# Patient Record
Sex: Female | Born: 1945 | Race: White | Hispanic: No | Smoking: Former smoker
Health system: Southern US, Community
[De-identification: ages and names within clinical notes are randomized; demographics above are authoritative.]

## PROBLEM LIST (undated history)

## (undated) DIAGNOSIS — E079 Disorder of thyroid, unspecified: Secondary | ICD-10-CM

## (undated) DIAGNOSIS — R760 Raised antibody titer: Secondary | ICD-10-CM

## (undated) DIAGNOSIS — F419 Anxiety disorder, unspecified: Secondary | ICD-10-CM

## (undated) DIAGNOSIS — D689 Coagulation defect, unspecified: Secondary | ICD-10-CM

## (undated) DIAGNOSIS — I341 Nonrheumatic mitral (valve) prolapse: Secondary | ICD-10-CM

## (undated) DIAGNOSIS — Z9889 Other specified postprocedural states: Secondary | ICD-10-CM

## (undated) DIAGNOSIS — E785 Hyperlipidemia, unspecified: Secondary | ICD-10-CM

## (undated) DIAGNOSIS — I340 Nonrheumatic mitral (valve) insufficiency: Secondary | ICD-10-CM

## (undated) DIAGNOSIS — E039 Hypothyroidism, unspecified: Secondary | ICD-10-CM

## (undated) DIAGNOSIS — R011 Cardiac murmur, unspecified: Secondary | ICD-10-CM

## (undated) HISTORY — DX: Nonrheumatic mitral (valve) prolapse: I34.1

## (undated) HISTORY — PX: TUBAL LIGATION: SHX77

## (undated) HISTORY — PX: TONSILLECTOMY: SUR1361

## (undated) HISTORY — DX: Disorder of thyroid, unspecified: E07.9

## (undated) HISTORY — DX: Coagulation defect, unspecified: D68.9

## (undated) HISTORY — DX: Nonrheumatic mitral (valve) insufficiency: I34.0

## (undated) HISTORY — DX: Cardiac murmur, unspecified: R01.1

## (undated) HISTORY — DX: Raised antibody titer: R76.0

## (undated) HISTORY — DX: Hyperlipidemia, unspecified: E78.5

---

## 2005-10-19 ENCOUNTER — Encounter: Admission: RE | Admit: 2005-10-19 | Discharge: 2005-10-19 | Payer: Self-pay | Admitting: Gastroenterology

## 2016-06-08 HISTORY — PX: CARDIAC CATHETERIZATION: SHX172

## 2016-07-21 ENCOUNTER — Institutional Professional Consult (permissible substitution) (INDEPENDENT_AMBULATORY_CARE_PROVIDER_SITE_OTHER): Payer: Federal, State, Local not specified - PPO | Admitting: Thoracic Surgery (Cardiothoracic Vascular Surgery)

## 2016-07-21 ENCOUNTER — Other Ambulatory Visit: Payer: Self-pay

## 2016-07-21 ENCOUNTER — Other Ambulatory Visit: Payer: Self-pay | Admitting: *Deleted

## 2016-07-21 VITALS — BP 153/82 | HR 92 | Resp 16 | Ht 63.0 in | Wt 102.0 lb

## 2016-07-21 DIAGNOSIS — Z01818 Encounter for other preprocedural examination: Secondary | ICD-10-CM

## 2016-07-21 DIAGNOSIS — I7409 Other arterial embolism and thrombosis of abdominal aorta: Secondary | ICD-10-CM

## 2016-07-21 DIAGNOSIS — I7101 Dissection of thoracic aorta: Secondary | ICD-10-CM

## 2016-07-21 DIAGNOSIS — I71019 Dissection of thoracic aorta, unspecified: Secondary | ICD-10-CM

## 2016-07-21 DIAGNOSIS — I340 Nonrheumatic mitral (valve) insufficiency: Secondary | ICD-10-CM | POA: Diagnosis not present

## 2016-07-21 DIAGNOSIS — R760 Raised antibody titer: Secondary | ICD-10-CM | POA: Diagnosis not present

## 2016-07-21 MED ORDER — AMIODARONE HCL 200 MG PO TABS: 200.0000 mg | ORAL_TABLET | Freq: Every day | ORAL | 0 refills | Status: DC

## 2016-07-21 NOTE — Patient Instructions (Signed)
Stop taking Coumadin and begin taking amiodarone on Saturday 2/24  Continue taking all other medications without change through the day before surgery.  Have nothing to eat or drink after midnight the night before surgery.  On the morning of surgery do not take any medications.

## 2016-07-21 NOTE — Progress Notes (Signed)
[address]       [place] [address]             [phone]     CARDIOTHORACIC SURGERY CONSULTATION REPORT  Referring Provider is Gypsy Balsam, MD PCP is Lise Auer, MD  Chief Complaint  Patient presents with  . Mitral Regurgitation    Surgical eval, Cardiac Cath 06/08/16, ECHO 04/04/16-@ [facility]     HPI:  Patient is a 71 year old female with reported history of mitral valve prolapse who has been referred for surgical consultation to discuss treatment options for management of severe mitral regurgitation. The patient states that she was told that she had mitral valve prolapse many years ago. She has never been formally evaluated by a cardiologist until recently. In 2006 the patient was hospitalized briefly at [facility] for what was felt to be a possible embolic event to her right thumb.  Prior to that the patient had reportedly experienced a similar episode of sudden onset of blue discoloration to the third finger of the right hand several months previously for which she has not seen medical attention. 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 She was started on Coumadin and transferred to [facility] in [place]. She was felt to have suffered a likely embolic event. According to her recollection no echocardiogram was done. 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 She has been on chronic warfarin anticoagulation ever since. The patient has not had any further episodes of possible embolic events and she has no documented history of atrial fibrillation. She recently complained to her primary care physician that she noted that if she climbed a flight of stairs she would note that her heart was pounding and racing faster than she thought appropriate. She denied any symptoms of  exertional shortness of breath. Her primary care physician noted a loud holosystolic murmur on physical exam and referred her for a transthoracic echocardiogram and cardiology evaluation. Echocardiogram performed 04/04/2016 in [place] was found to reveal normal left ventricular systolic function with mitral valve prolapse and "moderate mitral regurgitation". There was severe left atrial enlargement.  No other significant abnormalities were noted. The patient subsequently underwent left and right heart catheterization by Dr. Rhona Leavens on 06/08/2016.  The patient was found to have normal coronary arteries with normal left ventricular systolic function and normal right heart pressures. There was severe mitral regurgitation appreciated at catheterization. The patient was referred for elective surgical consultation.  The patient is widowed and lives in [place] with her sister. She has been relatively healthy for all of her life and she reports no significant physical limitations. She remains entirely functionally independent. However, she does not exercise on a regular basis and she does not walk regularly. She specifically denies any symptoms of exertional shortness of breath or chest discomfort.  She denies any history of irregular heart rhythm, dizzy spells, orthopnea, lower extremity edema, or syncope.  She has remained on Coumadin since 2006 with no bleeding complications.  Past Medical History:  Diagnosis Date  . Clotting disorder (HCC)   . Disease of thyroid gland   . Dyslipidemia   . Heart murmur   . Lupus anticoagulant positive   . Mitral regurgitation   . 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 Mitral valve prolapse     Past Surgical History:  Procedure Laterality Date  .  CARDIAC CATHETERIZATION  06/08/2016  . TONSILLECTOMY    . TUBAL LIGATION      Family History  Problem Relation Age of Onset  . Aortic aneurysm Mother   . Heart disease Father     Social History   Social History   . Marital status: Unknown    Spouse name: N/A  . Number of children: N/A  . Years of education: N/A   Occupational History  . retired    Social History Main Topics  . Smoking status: Former Smoker    Packs/day: 0.50    Years: 15.00    Types: Cigarettes    Quit date: 07/22/1979  . Smokeless tobacco: Never Used  . Alcohol use Yes     Comment: SOCIAL  . Drug use: No  . Sexual activity: Not Currently   Other Topics Concern  . Not on file   Social History Narrative  . No narrative on file    Current Outpatient Prescriptions  Medication Sig Dispense Refill  . amoxicillin (AMOXIL) 500 MG tablet     . ibandronate (BONIVA) 150 MG tablet Take 150 mg by mouth every 30 (thirty) days.     Marland Kitchen levothyroxine (SYNTHROID, LEVOTHROID) 75 MCG tablet Take 75 mcg by mouth daily before breakfast.     . pravastatin (PRAVACHOL) 20 MG tablet Take 20 mg by mouth daily.      No current facility-administered medications for this visit.     No Known Allergies    Review of Systems:   General:  normal appetite, normal energy, no weight gain, no weight loss, no fever  Cardiac:  no chest pain with exertion, no chest pain at rest, no SOB with exertion, no resting SOB, no PND, no orthopnea, + palpitations, no arrhythmia, no atrial fibrillation, no LE edema, no dizzy spells, no syncope  Respiratory:  no shortness of breath, no home oxygen, no productive cough, no dry cough, no bronchitis, no wheezing, no hemoptysis, no asthma, no pain with inspiration or cough, no sleep apnea, no CPAP at night  GI:   no difficulty swallowing, no reflux, no frequent heartburn, no hiatal hernia, no abdominal pain, no constipation, no diarrhea, no hematochezia, no hematemesis, no melena  GU:   no dysuria,  no frequency, no urinary tract infection, no hematuria, no enlarged prostate, no kidney stones, no kidney disease  Vascular:  no pain suggestive of claudication, no pain in feet, no leg cramps, no varicose veins, no DVT,  no non-healing foot ulcer  Neuro:   no stroke, no TIA's, no seizures, no headaches, no temporary blindness one eye,  no slurred speech, no peripheral neuropathy, no chronic pain, no instability of gait, no memory/cognitive dysfunction  Musculoskeletal: no arthritis, no joint swelling, no myalgias, no difficulty walking, normal mobility   Skin:   no rash, no itching, no skin infections, no pressure sores or ulcerations  Psych:   + anxiety, no depression, + nervousness, no unusual recent stress  Eyes:   no blurry vision, no floaters, no recent vision changes, + wears glasses or contacts  ENT:   no hearing loss, no loose or painful teeth, no dentures, last saw dentist 6 months ago and scheduled for routine cleaning next week  Hematologic:  no easy bruising, no abnormal bleeding, no clotting disorder, no frequent epistaxis  Endocrine:  no diabetes, does not check CBG's at home     Physical Exam:   BP (!) 153/82 (BP Location: Left Arm, Patient Position: Sitting, Cuff Size: Normal)  Pulse 92   Resp 16   Ht 5\' 3"  (1.6 m)   Wt 102 lb (46.3 kg)   SpO2 94% Comment: ON RA  BMI 18.07 kg/m   General:  Thin,  well-appearing  HEENT:  Unremarkable   Neck:   no JVD, no bruits, no adenopathy   Chest:   clear to auscultation, symmetrical breath sounds, no wheezes, no rhonchi   CV:   RRR, prominent grade IV/VI holosystolic murmur all across precordium  Abdomen:  soft, non-tender, no masses   Extremities:  warm, well-perfused, pulses diminished but palpable, no LE edema  Rectal/GU  Deferred  Neuro:   Grossly non-focal and symmetrical throughout  Skin:   Clean and dry, no rashes, no breakdown   Diagnostic Tests:  TRANSTHORACIC ECHOCARDIOGRAM  Both report and images from transthoracic echocardiogram performed 04/04/2016 are reviewed. 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  There is at least  moderate and likely severe mitral regurgitation. There is severe left atrial enlargement. There is normal left ventricular size and systolic function. Aortic valve appears normal.   DIAGNOSTIC CARDIAC CATHETERIZATION  Both images and report from diagnostic cardiac catheterization performed 06/08/2016 are reviewed. The patient has normal coronary artery anatomy with no significant coronary artery disease. Left ventricular size appears normal. Left ventricular function appears normal. There is severe mitral regurgitation by angiography. Pulmonary artery pressures were reportedly normal.   Impression:  Patient has stage C severe asymptomatic primary mitral regurgitation due to mitral valve prolapse.  I have personally reviewed the patient's recent transthoracic echocardiogram. There appears to be a large flail segment involving the middle scallop of the posterior leaflet with at least moderate and probably severe mitral regurgitation. Left ventricular size and systolic function appears normal. There are no other, complicating features. Diagnostic cardiac catheterization was notable for the absence of significant coronary artery disease and confirmed the presence of severe mitral regurgitation.   Options at this time include continued medical therapy with close follow-up versus elective mitral valve repair.  Based upon review of the patient's echocardiogram feel there is greater than 95% likelihood that her mitral valve can be repaired successfully with anticipation of an excellent and durable long-term result.  The patient appears remarkably healthy otherwise with exception of history of positive lupus anticoagulant. Risks associated with surgery should be very low. The patient is to be a good candidate for minimally invasive approach for surgery.   Plan:  The patient and her sister were counseled at length regarding the indications, risks and potential benefits of mitral valve repair.  The rationale for  elective surgery has been explained, including a comparison between surgery and continued medical therapy with close follow-up.  The likelihood of successful and durable valve repair has been discussed with particular reference to the findings of their recent echocardiogram.  Based upon these findings and previous experience, I have quoted them a greater than 95 percent likelihood of successful valve repair.   The patient understands and accepts all potential risks of surgery including but not limited to risk of death, stroke or other neurologic complication, myocardial infarction, congestive heart failure, respiratory failure, renal failure, bleeding requiring transfusion and/or reexploration, arrhythmia, infection or other wound complications, pneumonia, pleural and/or pericardial effusion, pulmonary embolus, aortic dissection or other major vascular complication, or delayed complications related to valve repair or replacement including but not limited to structural valve deterioration and failure, thrombosis, embolization, endocarditis, or  paravalvular leak.  Alternative surgical approaches have been discussed including a comparison between conventional sternotomy and minimally-invasive techniques.  The relative risks and benefits of each have been reviewed as they pertain to the patient's specific circumstances, and all of their questions have been addressed.  Specific risks potentially related to the minimally-invasive approach were discussed at length, including but not limited to risk of conversion to full or partial sternotomy, aortic dissection or other major vascular complication, unilateral acute lung injury or pulmonary edema, phrenic nerve dysfunction or paralysis, rib fracture, chronic pain, lung hernia, or lymphocele.  Expectations for her postoperative recovery have been discussed at length. All of their questions have been answered.  We tentatively plan to proceed with surgery on 07/28/2016. The  patient has been instructed to stop taking Coumadin on 07/23/2016 in anticipation of surgery. In addition, she has been given a prescription for amiodarone to begin prior to surgery to decrease her risk of perioperative atrial dysrhythmias.   I spent in excess of 90 minutes during the conduct of this office consultation and >50% of this time involved direct face-to-face encounter with the patient for counseling and/or coordination of their care.    Salvatore Decentlarence H. Cornelius Moraswen, MD 07/21/2016 4:47 PM

## 2016-07-22 ENCOUNTER — Other Ambulatory Visit: Payer: Self-pay | Admitting: *Deleted

## 2016-07-22 DIAGNOSIS — I340 Nonrheumatic mitral (valve) insufficiency: Secondary | ICD-10-CM

## 2016-07-24 NOTE — H&P (Signed)
[address]       [place] [address]             [phone]          CARDIOTHORACIC SURGERY HISTORY AND PHYSICAL EXAM  Referring Provider is Jenne Campus, MD PCP is Mateo Flow, MD      Chief Complaint  Patient presents with  . Mitral Regurgitation    Surgical eval, Cardiac Cath 06/08/16, ECHO 04/04/16-@ [facility]     HPI:  Patient is a 71 year old female with reported history of mitral valve prolapse who has been referred for surgical consultation to discuss treatment options for management of severe mitral regurgitation. The patient states that she was told that she had mitral valve prolapse many years ago. She has never been formally evaluated by a cardiologist until recently. In 2006 the patient was hospitalized briefly at [facility] for what was felt to be a possible embolic event to her right thumb.  Prior to that the patient had reportedly experienced a similar episode of sudden onset of blue discoloration to the third finger of the right hand several months previously for which she has not seen medical attention. 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 She was started on Coumadin and transferred to [facility] in [place]. She was felt to have suffered a likely embolic event. According to her recollection no echocardiogram was done. 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 She has been on chronic warfarin anticoagulation ever since. The patient has not had any further episodes of possible embolic events and she has no documented history of atrial fibrillation. She recently complained to her primary care physician that she noted that if she climbed a flight of stairs she would note that her heart was pounding and racing faster than she thought appropriate. She  denied any symptoms of exertional shortness of breath. Her primary care physician noted a loud holosystolic murmur on physical exam and referred her for a transthoracic echocardiogram and cardiology evaluation. Echocardiogram performed 04/04/2016 in  was found to reveal normal left ventricular systolic function with mitral valve prolapse and "moderate mitral regurgitation". There was severe left atrial enlargement.  No other significant abnormalities were noted. The patient subsequently underwent left and right heart catheterization by Dr. Wyline Copas on 06/08/2016.  The patient was found to have normal coronary arteries with normal left ventricular systolic function and normal right heart pressures. There was severe mitral regurgitation appreciated at catheterization. The patient was referred for elective surgical consultation.  The patient is widowed and lives in [place] with her sister. She has been relatively healthy for all of her life and she reports no significant physical limitations. She remains entirely functionally independent. However, she does not exercise on a regular basis and she does not walk regularly. She specifically denies any symptoms of exertional shortness of breath or chest discomfort.  She denies any history of irregular heart rhythm, dizzy spells, orthopnea, lower extremity edema, or syncope.  She has remained on Coumadin since 2006 with no bleeding complications.    Past Medical History:  Diagnosis Date  . Clotting disorder ([place])   . Disease of thyroid gland   . Dyslipidemia   . Heart murmur   . Lupus anticoagulant positive   . Mitral regurgitation   . 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 Mitral valve  prolapse     Past Surgical History:  Procedure Laterality Date  . CARDIAC CATHETERIZATION  06/08/2016  . TONSILLECTOMY    . TUBAL LIGATION      Family History  Problem Relation Age of Onset  . Aortic aneurysm Mother   . Heart disease Father      Social History Social History  Substance Use Topics  . Smoking status: Former Smoker    Packs/day: 0.50    Years: 15.00    Types: Cigarettes    Quit date: 07/22/1979  . Smokeless tobacco: Never Used  . Alcohol use Yes     Comment: SOCIAL    Prior to Admission medications   Medication Sig Start Date End Date Taking? Authorizing Provider  ALPRAZolam Duanne Moron) 0.5 MG tablet Take 0.25 mg by mouth at bedtime as needed for anxiety.   Yes Historical Provider, MD  calcium carbonate (OSCAL) 1500 (600 Ca) MG TABS tablet Take 1,500 mg by mouth 2 (two) times daily with a meal.   Yes Historical Provider, MD  ibandronate (BONIVA) 150 MG tablet Take 150 mg by mouth every 30 (thirty) days.  07/18/16  Yes Historical Provider, MD  levothyroxine (SYNTHROID, LEVOTHROID) 75 MCG tablet Take 75 mcg by mouth daily before breakfast.  07/18/16  Yes Historical Provider, MD  MAGNESIUM-ZINC PO Take 1 tablet by mouth daily.   Yes Historical Provider, MD  MetroNIDAZOLE-Cleanser 0.75 % (Cream) KIT Apply 1 application topically at bedtime.   Yes Historical Provider, MD  Multiple Vitamin (MULTIVITAMIN WITH MINERALS) TABS tablet Take 1 tablet by mouth daily.   Yes Historical Provider, MD  Omega-3 Fatty Acids (FISH OIL) 1200 MG CAPS Take 2,400 mg by mouth daily.   Yes Historical Provider, MD  pravastatin (PRAVACHOL) 20 MG tablet Take 20 mg by mouth every evening.  06/20/16  Yes Historical Provider, MD  vitamin C (ASCORBIC ACID) 500 MG tablet Take 500 mg by mouth 2 (two) times daily.   Yes Historical Provider, MD  warfarin (COUMADIN) 5 MG tablet Take 5 mg by mouth daily.   Yes Historical Provider, MD  amiodarone (PACERONE) 200 MG tablet Take 1 tablet (200 mg total) by mouth daily. Patient taking differently: Take 200 mg by mouth daily. Will start medication 07-23-2016 07/21/16   Rexene Alberts, MD    No Known Allergies   Review of Systems:              General:                      normal appetite, normal energy, no  weight gain, no weight loss, no fever             Cardiac:                       no chest pain with exertion, no chest pain at rest, no SOB with exertion, no resting SOB, no PND, no orthopnea, + palpitations, no arrhythmia, no atrial fibrillation, no LE edema, no dizzy spells, no syncope             Respiratory:                 no shortness of breath, no home oxygen, no productive cough, no dry cough, no bronchitis, no wheezing, no hemoptysis, no asthma, no pain with inspiration or cough, no sleep apnea, no CPAP at night             GI:  no difficulty swallowing, no reflux, no frequent heartburn, no hiatal hernia, no abdominal pain, no constipation, no diarrhea, no hematochezia, no hematemesis, no melena             GU:                              no dysuria,  no frequency, no urinary tract infection, no hematuria, no enlarged prostate, no kidney stones, no kidney disease             Vascular:                     no pain suggestive of claudication, no pain in feet, no leg cramps, no varicose veins, no DVT, no non-healing foot ulcer             Neuro:                         no stroke, no TIA's, no seizures, no headaches, no temporary blindness one eye,  no slurred speech, no peripheral neuropathy, no chronic pain, no instability of gait, no memory/cognitive dysfunction             Musculoskeletal:         no arthritis, no joint swelling, no myalgias, no difficulty walking, normal mobility              Skin:                            no rash, no itching, no skin infections, no pressure sores or ulcerations             Psych:                         + anxiety, no depression, + nervousness, no unusual recent stress             Eyes:                           no blurry vision, no floaters, no recent vision changes, + wears glasses or contacts             ENT:                            no hearing loss, no loose or painful teeth, no dentures, last saw dentist 6 months ago and  scheduled for routine cleaning next week             Hematologic:               no easy bruising, no abnormal bleeding, no clotting disorder, no frequent epistaxis             Endocrine:                   no diabetes, does not check CBG's at home                           Physical Exam:              BP (!) 153/82 (BP Location: Left Arm, Patient Position: Sitting, Cuff Size: Normal)   Pulse 92   Resp 16   Ht  $'5\' 3"'z$  (1.6 m)   Wt 102 lb (46.3 kg)   SpO2 94% Comment: ON RA  BMI 18.07 kg/m              General:                      Thin,  well-appearing             HEENT:                       Unremarkable              Neck:                           no JVD, no bruits, no adenopathy              Chest:                          clear to auscultation, symmetrical breath sounds, no wheezes, no rhonchi              CV:                              RRR, prominent grade IV/VI holosystolic murmur all across precordium             Abdomen:                    soft, non-tender, no masses              Extremities:                 warm, well-perfused, pulses diminished but palpable, no LE edema             Rectal/GU                   Deferred             Neuro:                         Grossly non-focal and symmetrical throughout             Skin:                            Clean and dry, no rashes, no breakdown   Diagnostic Tests:  TRANSTHORACIC ECHOCARDIOGRAM  Both report and images from transthoracic echocardiogram performed 04/04/2016 are reviewed. 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  There is at least moderate and likely severe mitral regurgitation. There is severe left atrial enlargement. There is normal left ventricular size and systolic function. Aortic valve appears normal.   DIAGNOSTIC CARDIAC CATHETERIZATION  Both images and report from diagnostic cardiac  catheterization performed 06/08/2016 are reviewed. The patient has normal coronary artery anatomy with no significant coronary artery disease. Left ventricular size appears normal. Left ventricular function appears normal. There is severe mitral regurgitation by angiography. Pulmonary artery pressures were reportedly normal.   Impression:  Patient has stage C severe asymptomatic primary mitral regurgitation due to mitral valve prolapse.  I have personally reviewed the patient's recent transthoracic echocardiogram. There appears to be a large  flail segment involving the middle scallop of the posterior leaflet with at least moderate and probably severe mitral regurgitation. Left ventricular size and systolic function appears normal. There are no other, complicating features. Diagnostic cardiac catheterization was notable for the absence of significant coronary artery disease and confirmed the presence of severe mitral regurgitation.   Options at this time include continued medical therapy with close follow-up versus elective mitral valve repair.  Based upon review of the patient's echocardiogram feel there is greater than 95% likelihood that her mitral valve can be repaired successfully with anticipation of an excellent and durable long-term result.  The patient appears remarkably healthy otherwise with exception of history of positive lupus anticoagulant. Risks associated with surgery should be very low. The patient is to be a good candidate for minimally invasive approach for surgery.   Plan:  The patient and her sister were counseled at length regarding the indications, risks and potential benefits of mitral valve repair.  The rationale for elective surgery has been explained, including a comparison between surgery and continued medical therapy with close follow-up.  The likelihood of successful and durable valve repair has been discussed with particular reference to the findings of their recent  echocardiogram.  Based upon these findings and previous experience, I have quoted them a greater than 95 percent likelihood of successful valve repair.   The patient understands and accepts all potential risks of surgery including but not limited to risk of death, stroke or other neurologic complication, myocardial infarction, congestive heart failure, respiratory failure, renal failure, bleeding requiring transfusion and/or reexploration, arrhythmia, infection or other wound complications, pneumonia, pleural and/or pericardial effusion, pulmonary embolus, aortic dissection or other major vascular complication, or delayed complications related to valve repair or replacement including but not limited to structural valve deterioration and failure, thrombosis, embolization, endocarditis, or paravalvular leak.  Alternative surgical approaches have been discussed including a comparison between conventional sternotomy and minimally-invasive techniques.  The relative risks and benefits of each have been reviewed as they pertain to the patient's specific circumstances, and all of their questions have been addressed.  Specific risks potentially related to the minimally-invasive approach were discussed at length, including but not limited to risk of conversion to full or partial sternotomy, aortic dissection or other major vascular complication, unilateral acute lung injury or pulmonary edema, phrenic nerve dysfunction or paralysis, rib fracture, chronic pain, lung hernia, or lymphocele.  Expectations for her postoperative recovery have been discussed at length. All of their questions have been answered.  We tentatively plan to proceed with surgery on 07/28/2016. The patient has been instructed to stop taking Coumadin on 07/23/2016 in anticipation of surgery. In addition, she has been given a prescription for amiodarone to begin prior to surgery to decrease her risk of perioperative atrial dysrhythmias.   I spent in  excess of 90 minutes during the conduct of this office consultation and >50% of this time involved direct face-to-face encounter with the patient for counseling and/or coordination of their care.    Valentina Gu. Roxy Manns, MD 07/21/2016 4:47 PM

## 2016-07-25 NOTE — Pre-Procedure Instructions (Signed)
Hannah SkeenJanet Fitzgerald  07/25/2016      [facility] - [place], [place] - [address] [place] [address] Phone: [phone] Fax: [phone]    Your procedure is scheduled on Thurs, Mar 1 @ 7:30 AM  [facility] at 5:30 AM  Call this number if you have problems the morning of surgery:  [phone]   Remember:  Do not eat food or drink liquids after midnight.  Take these medicines the morning of surgery with A SIP OF WATER Amiodarone(Pacerone) and  Synthroid(Levothyroxine)             Stop taking your Coumadin along with any Vitamins or Herbal Medications. No Goody's,BC's,Aleve,Advil,Motrin,Ibuprofen,or Fish Oil.    Do not wear jewelry, make-up or nail polish.  Do not wear lotions, powders, perfumes, or deoderant.  Do not shave 48 hours prior to surgery.    Do not bring valuables to the hospital.  [facility] is not responsible for any belongings or valuables.  Contacts, dentures or bridgework may not be worn into surgery.  Leave your suitcase in the car.  After surgery it may be brought to your room.  For patients admitted to the hospital, discharge time will be determined by your treatment team.  Patients discharged the day of surgery will not be allowed to drive home.   [facility]  Before surgery, you can play an important role.  Because skin is not sterile, your skin needs to be as free of germs as possible.  You can reduce the number of germs on you skin by washing with CHG (chlorahexidine gluconate) soap before surgery.  CHG is an antiseptic cleaner which kills germs and bonds with the skin to continue killing germs even after washing.  Please DO NOT use if you have an allergy to CHG or antibacterial soaps.  If your skin becomes reddened/irritated stop using the CHG and inform your nurse when you arrive at Short Stay.  Do not shave (including legs and underarms) for at  least 48 hours prior to the first CHG shower.  You may shave your face.  Please follow these instructions carefully:   1.  Shower with CHG Soap the night before surgery and the                                morning of Surgery.  2.  If you choose to wash your hair, wash your hair first as usual with your       normal shampoo.  3.  After you shampoo, rinse your hair and body thoroughly to remove the                      Shampoo.  4.  Use CHG as you would any other liquid soap.  You can apply chg directly       to the skin and wash gently with scrungie or a clean washcloth.  5.  Apply the CHG Soap to your body ONLY FROM THE NECK DOWN.        Do not use on open wounds or open sores.  Avoid contact with your eyes,       ears, mouth and genitals (private parts).  Wash genitals (private parts)       with your normal soap.  6.  Wash thoroughly, paying special attention to the  area where your surgery        will be performed.  7.  Thoroughly rinse your body with warm water from the neck down.  8.  DO NOT shower/wash with your normal soap after using and rinsing off       the CHG Soap.  9.  Pat yourself dry with a clean towel.            10.  Wear clean pajamas.            11.  Place clean sheets on your bed the night of your first shower and do not        sleep with pets.  Day of Surgery  Do not apply any lotions/deoderants the morning of surgery.  Please wear clean clothes to the hospital/surgery center.    Please read over the following fact sheets that you were given. Pain Booklet, Coughing and Deep Breathing, MRSA Information and Surgical Site Infection Prevention

## 2016-07-26 ENCOUNTER — Ambulatory Visit (HOSPITAL_COMMUNITY)
Admission: RE | Admit: 2016-07-26 | Discharge: 2016-07-26 | Disposition: A | Discharge status: Home / Self Care | Payer: Federal, State, Local not specified - PPO | Source: Ambulatory Visit | Attending: Thoracic Surgery (Cardiothoracic Vascular Surgery) | Admitting: Thoracic Surgery (Cardiothoracic Vascular Surgery)

## 2016-07-26 ENCOUNTER — Encounter (HOSPITAL_COMMUNITY): Payer: Self-pay

## 2016-07-26 ENCOUNTER — Encounter (HOSPITAL_COMMUNITY)
Admission: RE | Admit: 2016-07-26 | Discharge: 2016-07-26 | Disposition: A | Discharge status: Home / Self Care | Payer: Federal, State, Local not specified - PPO | Source: Ambulatory Visit | Attending: Thoracic Surgery (Cardiothoracic Vascular Surgery) | Admitting: Thoracic Surgery (Cardiothoracic Vascular Surgery)

## 2016-07-26 ENCOUNTER — Ambulatory Visit
Admission: RE | Admit: 2016-07-26 | Discharge: 2016-07-26 | Disposition: A | Discharge status: Home / Self Care | Payer: Federal, State, Local not specified - PPO | Source: Ambulatory Visit | Attending: Thoracic Surgery (Cardiothoracic Vascular Surgery) | Admitting: Thoracic Surgery (Cardiothoracic Vascular Surgery)

## 2016-07-26 DIAGNOSIS — I71019 Dissection of thoracic aorta, unspecified: Secondary | ICD-10-CM

## 2016-07-26 DIAGNOSIS — Z0183 Encounter for blood typing: Secondary | ICD-10-CM | POA: Insufficient documentation

## 2016-07-26 DIAGNOSIS — I7101 Dissection of thoracic aorta: Secondary | ICD-10-CM

## 2016-07-26 DIAGNOSIS — Z01818 Encounter for other preprocedural examination: Secondary | ICD-10-CM

## 2016-07-26 DIAGNOSIS — I340 Nonrheumatic mitral (valve) insufficiency: Secondary | ICD-10-CM

## 2016-07-26 DIAGNOSIS — I6521 Occlusion and stenosis of right carotid artery: Secondary | ICD-10-CM | POA: Insufficient documentation

## 2016-07-26 DIAGNOSIS — I7409 Other arterial embolism and thrombosis of abdominal aorta: Secondary | ICD-10-CM

## 2016-07-26 DIAGNOSIS — Z01812 Encounter for preprocedural laboratory examination: Secondary | ICD-10-CM | POA: Insufficient documentation

## 2016-07-26 DIAGNOSIS — I4510 Unspecified right bundle-branch block: Secondary | ICD-10-CM | POA: Diagnosis not present

## 2016-07-26 HISTORY — DX: Hypothyroidism, unspecified: E03.9

## 2016-07-26 HISTORY — DX: Anxiety disorder, unspecified: F41.9

## 2016-07-26 LAB — COMPREHENSIVE METABOLIC PANEL
ALK PHOS: 37 U/L — AB (ref 38–126)
ALT: 16 U/L (ref 14–54)
ANION GAP: 11 (ref 5–15)
AST: 17 U/L (ref 15–41)
Albumin: 4.4 g/dL (ref 3.5–5.0)
BUN: 16 mg/dL (ref 6–20)
CALCIUM: 9.4 mg/dL (ref 8.9–10.3)
CHLORIDE: 105 mmol/L (ref 101–111)
CO2: 23 mmol/L (ref 22–32)
CREATININE: 0.69 mg/dL (ref 0.44–1.00)
Glucose, Bld: 109 mg/dL — ABNORMAL HIGH (ref 65–99)
Potassium: 4.1 mmol/L (ref 3.5–5.1)
Sodium: 139 mmol/L (ref 135–145)
Total Bilirubin: 0.8 mg/dL (ref 0.3–1.2)
Total Protein: 6.6 g/dL (ref 6.5–8.1)

## 2016-07-26 LAB — BLOOD GAS, ARTERIAL
ACID-BASE EXCESS: 1.6 mmol/L (ref 0.0–2.0)
Bicarbonate: 26.1 mmol/L (ref 20.0–28.0)
Drawn by: 222751
FIO2: 0.21
O2 SAT: 94 %
PCO2 ART: 44.9 mmHg (ref 32.0–48.0)
PH ART: 7.383 (ref 7.350–7.450)
PO2 ART: 75 mmHg — AB (ref 83.0–108.0)
Patient temperature: 98.6

## 2016-07-26 LAB — PULMONARY FUNCTION TEST
DL/VA % PRED: 88 %
DL/VA: 4.16 ml/min/mmHg/L
DLCO COR: 16.68 ml/min/mmHg
DLCO UNC % PRED: 74 %
DLCO cor % pred: 72 %
DLCO unc: 17.12 ml/min/mmHg
FEF 25-75 POST: 2.41 L/s
FEF 25-75 PRE: 2.54 L/s
FEF2575-%CHANGE-POST: -4 %
FEF2575-%PRED-POST: 140 %
FEF2575-%PRED-PRE: 147 %
FEV1-%Change-Post: -1 %
FEV1-%PRED-PRE: 120 %
FEV1-%Pred-Post: 118 %
FEV1-Post: 2.48 L
FEV1-Pre: 2.51 L
FEV1FVC-%Change-Post: 1 %
FEV1FVC-%PRED-PRE: 107 %
FEV6-%CHANGE-POST: -2 %
FEV6-%PRED-POST: 114 %
FEV6-%Pred-Pre: 118 %
FEV6-Post: 3.05 L
FEV6-Pre: 3.13 L
FEV6FVC-%Pred-Post: 105 %
FEV6FVC-%Pred-Pre: 105 %
FVC-%Change-Post: -2 %
FVC-%Pred-Post: 109 %
FVC-%Pred-Pre: 112 %
FVC-Post: 3.05 L
FVC-Pre: 3.13 L
POST FEV1/FVC RATIO: 81 %
PRE FEV1/FVC RATIO: 80 %
Post FEV6/FVC ratio: 100 %
Pre FEV6/FVC Ratio: 100 %
RV % pred: 141 %
RV: 3.09 L
TLC % pred: 112 %
TLC: 5.51 L

## 2016-07-26 LAB — VAS US DOPPLER PRE CABG
LCCAPDIAS: 16 cm/s
LCCAPSYS: 86 cm/s
LEFT ECA DIAS: -7 cm/s
LEFT VERTEBRAL DIAS: -26 cm/s
LICADDIAS: -26 cm/s
LICAPSYS: -77 cm/s
Left CCA dist dias: -21 cm/s
Left CCA dist sys: -83 cm/s
Left ICA dist sys: -69 cm/s
Left ICA prox dias: -31 cm/s
RCCAPDIAS: 17 cm/s
RIGHT ECA DIAS: -9 cm/s
RIGHT VERTEBRAL DIAS: -17 cm/s
Right CCA prox sys: 83 cm/s
Right cca dist sys: -77 cm/s

## 2016-07-26 LAB — CBC
HCT: 40.5 % (ref 36.0–46.0)
Hemoglobin: 13.6 g/dL (ref 12.0–15.0)
MCH: 31.9 pg (ref 26.0–34.0)
MCHC: 33.6 g/dL (ref 30.0–36.0)
MCV: 94.8 fL (ref 78.0–100.0)
PLATELETS: 244 10*3/uL (ref 150–400)
RBC: 4.27 MIL/uL (ref 3.87–5.11)
RDW: 13.1 % (ref 11.5–15.5)
WBC: 6.9 10*3/uL (ref 4.0–10.5)

## 2016-07-26 LAB — PROTIME-INR
INR: 1.1
PROTHROMBIN TIME: 14.2 s (ref 11.4–15.2)

## 2016-07-26 LAB — SURGICAL PCR SCREEN
MRSA, PCR: NEGATIVE
STAPHYLOCOCCUS AUREUS: NEGATIVE

## 2016-07-26 LAB — APTT: APTT: 30 s (ref 24–36)

## 2016-07-26 MED ORDER — CHLORHEXIDINE GLUCONATE 4 % EX LIQD: 30.0000 mL | CUTANEOUS | Status: DC

## 2016-07-26 MED ORDER — IOPAMIDOL (ISOVUE-370) INJECTION 76%
75.0000 mL | Freq: Once | INTRAVENOUS | Status: AC | PRN
  Administered 2016-07-26: 75 mL via INTRAVENOUS

## 2016-07-26 MED ORDER — ALBUTEROL SULFATE (2.5 MG/3ML) 0.083% IN NEBU
2.5000 mg | INHALATION_SOLUTION | Freq: Once | RESPIRATORY_TRACT | Status: AC
  Administered 2016-07-26: 2.5 mg via RESPIRATORY_TRACT

## 2016-07-26 NOTE — Progress Notes (Signed)
Pre-op Cardiac Surgery  Carotid Findings:  Right :1% to 39% ICA stenosis. Left : no evidence of ICA stenosis. Bilateral - Vertebral artery flow is antegrade.  Upper Extremity Right Left  Brachial Pressures 142 Triphasic 137 Triphasic  Radial Waveforms Triphasic Triphasic  Ulnar Waveforms Triphasic Triphasic  Palmar Arch (Allen's Test) Normal Normal   Findings:  Doppler waveforms remained normal bilaterally with both radial and ulnar compressions   [place] Electric[place]ia Tyshawn Fitzgerald RVS 07/26/2016 11:15 PM

## 2016-07-26 NOTE — Progress Notes (Signed)
PCP: Dr. Luna KitchensJaber Khan  Cardiologist: Dr. Bing MatterKrasowski  EKG: denies past year  ECHO: 07/26/15 in [facility]  Cardiac Cath: 06/25/15 in [facility]  Stress test: denies past year  Chest X-ray: 05/2016

## 2016-07-27 ENCOUNTER — Other Ambulatory Visit (HOSPITAL_COMMUNITY): Payer: Self-pay | Admitting: *Deleted

## 2016-07-27 ENCOUNTER — Other Ambulatory Visit: Payer: Federal, State, Local not specified - PPO

## 2016-07-27 LAB — HEMOGLOBIN A1C
Hgb A1c MFr Bld: 5.3 % (ref 4.8–5.6)
Mean Plasma Glucose: 105 mg/dL

## 2016-07-27 MED ORDER — MAGNESIUM SULFATE 50 % IJ SOLN
40.0000 meq | INTRAMUSCULAR | Status: DC
  Filled 2016-07-27: qty 10

## 2016-07-27 MED ORDER — DEXTROSE 5 % IV SOLN
750.0000 mg | INTRAVENOUS | Status: DC
  Filled 2016-07-27: qty 750

## 2016-07-27 MED ORDER — DEXTROSE 5 % IV SOLN
1.5000 g | INTRAVENOUS | Status: AC
  Administered 2016-07-28: .75 g via INTRAVENOUS
  Administered 2016-07-28: 1.5 g via INTRAVENOUS
  Filled 2016-07-27: qty 1.5

## 2016-07-27 MED ORDER — DEXMEDETOMIDINE HCL IN NACL 400 MCG/100ML IV SOLN
0.1000 ug/kg/h | INTRAVENOUS | Status: AC
  Administered 2016-07-28: .3 ug/kg/h via INTRAVENOUS
  Filled 2016-07-27: qty 100

## 2016-07-27 MED ORDER — VANCOMYCIN HCL 1000 MG IV SOLR
INTRAVENOUS | Status: AC
  Administered 2016-07-28: 1000 mL
  Filled 2016-07-27: qty 1000

## 2016-07-27 MED ORDER — PAPAVERINE HCL 30 MG/ML IJ SOLN
INTRAMUSCULAR | Status: DC
  Filled 2016-07-27: qty 2.5

## 2016-07-27 MED ORDER — DOPAMINE-DEXTROSE 3.2-5 MG/ML-% IV SOLN
0.0000 ug/kg/min | INTRAVENOUS | Status: DC
  Filled 2016-07-27: qty 250

## 2016-07-27 MED ORDER — SODIUM CHLORIDE 0.9 % IV SOLN
1250.0000 mg | INTRAVENOUS | Status: AC
  Administered 2016-07-28: 1250 mg via INTRAVENOUS
  Filled 2016-07-27 (×2): qty 1250

## 2016-07-27 MED ORDER — POTASSIUM CHLORIDE 2 MEQ/ML IV SOLN
80.0000 meq | INTRAVENOUS | Status: DC
  Filled 2016-07-27: qty 40

## 2016-07-27 MED ORDER — TRANEXAMIC ACID (OHS) PUMP PRIME SOLUTION
2.0000 mg/kg | INTRAVENOUS | Status: DC
Start: 2016-07-28 — End: 2016-07-28
  Filled 2016-07-27: qty 0.93

## 2016-07-27 MED ORDER — GLUTARALDEHYDE 0.625% SOAKING SOLUTION
TOPICAL | Status: DC | PRN
  Filled 2016-07-27: qty 50

## 2016-07-27 MED ORDER — SODIUM CHLORIDE 0.9 % IV SOLN
INTRAVENOUS | Status: DC
  Filled 2016-07-27: qty 30

## 2016-07-27 MED ORDER — TRANEXAMIC ACID (OHS) BOLUS VIA INFUSION
15.0000 mg/kg | INTRAVENOUS | Status: AC
  Administered 2016-07-28: 700.5 mg via INTRAVENOUS
  Filled 2016-07-27: qty 701

## 2016-07-27 MED ORDER — TRANEXAMIC ACID 1000 MG/10ML IV SOLN
1.5000 mg/kg/h | INTRAVENOUS | Status: AC
  Administered 2016-07-28: 1.5 mg/kg/h via INTRAVENOUS
  Filled 2016-07-27: qty 25

## 2016-07-27 MED ORDER — NITROGLYCERIN IN D5W 200-5 MCG/ML-% IV SOLN
2.0000 ug/min | INTRAVENOUS | Status: DC
  Filled 2016-07-27: qty 250

## 2016-07-27 MED ORDER — EPINEPHRINE PF 1 MG/ML IJ SOLN
0.0000 ug/min | INTRAVENOUS | Status: DC
  Filled 2016-07-27: qty 4

## 2016-07-27 MED ORDER — INSULIN REGULAR HUMAN 100 UNIT/ML IJ SOLN
INTRAMUSCULAR | Status: AC
  Administered 2016-07-28: 1.4 [IU]/h via INTRAVENOUS
  Filled 2016-07-27: qty 2.5

## 2016-07-27 MED ORDER — SODIUM CHLORIDE 0.9 % IV SOLN
30.0000 ug/min | INTRAVENOUS | Status: AC
  Administered 2016-07-28: 25 ug/min via INTRAVENOUS
  Filled 2016-07-27: qty 2

## 2016-07-28 ENCOUNTER — Encounter (HOSPITAL_COMMUNITY)
Admission: RE | Disposition: A | Discharge status: Home / Self Care | Payer: Self-pay | Source: Ambulatory Visit | Attending: Thoracic Surgery (Cardiothoracic Vascular Surgery)

## 2016-07-28 ENCOUNTER — Inpatient Hospital Stay (HOSPITAL_COMMUNITY): Payer: Medicare Other

## 2016-07-28 ENCOUNTER — Inpatient Hospital Stay (HOSPITAL_COMMUNITY): Payer: Medicare Other | Admitting: Certified Registered Nurse Anesthetist

## 2016-07-28 ENCOUNTER — Encounter (HOSPITAL_COMMUNITY): Payer: Self-pay | Admitting: *Deleted

## 2016-07-28 ENCOUNTER — Inpatient Hospital Stay (HOSPITAL_COMMUNITY)
Admission: RE | Admit: 2016-07-28 | Discharge: 2016-08-04 | DRG: 220 | Disposition: A | Discharge status: Home / Self Care | Payer: Medicare Other | Source: Ambulatory Visit | Attending: Thoracic Surgery (Cardiothoracic Vascular Surgery) | Admitting: Thoracic Surgery (Cardiothoracic Vascular Surgery)

## 2016-07-28 DIAGNOSIS — E039 Hypothyroidism, unspecified: Secondary | ICD-10-CM | POA: Diagnosis present

## 2016-07-28 DIAGNOSIS — I440 Atrioventricular block, first degree: Secondary | ICD-10-CM | POA: Diagnosis present

## 2016-07-28 DIAGNOSIS — Z79899 Other long term (current) drug therapy: Secondary | ICD-10-CM | POA: Diagnosis not present

## 2016-07-28 DIAGNOSIS — D696 Thrombocytopenia, unspecified: Secondary | ICD-10-CM | POA: Diagnosis present

## 2016-07-28 DIAGNOSIS — I4892 Unspecified atrial flutter: Secondary | ICD-10-CM | POA: Diagnosis present

## 2016-07-28 DIAGNOSIS — I480 Paroxysmal atrial fibrillation: Secondary | ICD-10-CM | POA: Diagnosis present

## 2016-07-28 DIAGNOSIS — D62 Acute posthemorrhagic anemia: Secondary | ICD-10-CM | POA: Diagnosis not present

## 2016-07-28 DIAGNOSIS — Z87891 Personal history of nicotine dependence: Secondary | ICD-10-CM

## 2016-07-28 DIAGNOSIS — E785 Hyperlipidemia, unspecified: Secondary | ICD-10-CM | POA: Diagnosis present

## 2016-07-28 DIAGNOSIS — D6862 Lupus anticoagulant syndrome: Secondary | ICD-10-CM | POA: Diagnosis present

## 2016-07-28 DIAGNOSIS — I341 Nonrheumatic mitral (valve) prolapse: Secondary | ICD-10-CM | POA: Diagnosis present

## 2016-07-28 DIAGNOSIS — J9811 Atelectasis: Secondary | ICD-10-CM

## 2016-07-28 DIAGNOSIS — I340 Nonrheumatic mitral (valve) insufficiency: Principal | ICD-10-CM | POA: Diagnosis present

## 2016-07-28 DIAGNOSIS — Z9889 Other specified postprocedural states: Secondary | ICD-10-CM

## 2016-07-28 DIAGNOSIS — R760 Raised antibody titer: Secondary | ICD-10-CM | POA: Diagnosis present

## 2016-07-28 DIAGNOSIS — Z7901 Long term (current) use of anticoagulants: Secondary | ICD-10-CM

## 2016-07-28 DIAGNOSIS — I509 Heart failure, unspecified: Secondary | ICD-10-CM

## 2016-07-28 DIAGNOSIS — Z952 Presence of prosthetic heart valve: Secondary | ICD-10-CM

## 2016-07-28 HISTORY — DX: Other specified postprocedural states: Z98.890

## 2016-07-28 HISTORY — PX: MITRAL VALVE REPAIR: SHX2039

## 2016-07-28 HISTORY — PX: TEE WITHOUT CARDIOVERSION: SHX5443

## 2016-07-28 LAB — PROTIME-INR
INR: 1.62
Prothrombin Time: 19.4 seconds — ABNORMAL HIGH (ref 11.4–15.2)

## 2016-07-28 LAB — POCT I-STAT, CHEM 8
BUN: 11 mg/dL (ref 6–20)
BUN: 10 mg/dL (ref 6–20)
BUN: 7 mg/dL (ref 6–20)
BUN: 6 mg/dL (ref 6–20)
CALCIUM ION: 1.15 mmol/L (ref 1.15–1.40)
CHLORIDE: 104 mmol/L (ref 101–111)
CHLORIDE: 98 mmol/L — AB (ref 101–111)
CHLORIDE: 110 mmol/L (ref 101–111)
CREATININE: 0.6 mg/dL (ref 0.44–1.00)
CREATININE: 0.3 mg/dL — AB (ref 0.44–1.00)
CREATININE: 0.5 mg/dL (ref 0.44–1.00)
Calcium, Ion: 1.18 mmol/L (ref 1.15–1.40)
Calcium, Ion: 0.93 mmol/L — ABNORMAL LOW (ref 1.15–1.40)
Calcium, Ion: 0.93 mmol/L — ABNORMAL LOW (ref 1.15–1.40)
Chloride: 104 mmol/L (ref 101–111)
Creatinine, Ser: 0.5 mg/dL (ref 0.44–1.00)
GLUCOSE: 103 mg/dL — AB (ref 65–99)
Glucose, Bld: 139 mg/dL — ABNORMAL HIGH (ref 65–99)
Glucose, Bld: 127 mg/dL — ABNORMAL HIGH (ref 65–99)
Glucose, Bld: 164 mg/dL — ABNORMAL HIGH (ref 65–99)
HCT: 32 % — ABNORMAL LOW (ref 36.0–46.0)
HCT: 26 % — ABNORMAL LOW (ref 36.0–46.0)
HEMATOCRIT: 36 % (ref 36.0–46.0)
HEMATOCRIT: 22 % — AB (ref 36.0–46.0)
HEMOGLOBIN: 8.8 g/dL — AB (ref 12.0–15.0)
Hemoglobin: 10.9 g/dL — ABNORMAL LOW (ref 12.0–15.0)
Hemoglobin: 12.2 g/dL (ref 12.0–15.0)
Hemoglobin: 7.5 g/dL — ABNORMAL LOW (ref 12.0–15.0)
POTASSIUM: 3.5 mmol/L (ref 3.5–5.1)
POTASSIUM: 3.9 mmol/L (ref 3.5–5.1)
Potassium: 4 mmol/L (ref 3.5–5.1)
Potassium: 3.7 mmol/L (ref 3.5–5.1)
SODIUM: 140 mmol/L (ref 135–145)
SODIUM: 145 mmol/L (ref 135–145)
Sodium: 142 mmol/L (ref 135–145)
Sodium: 139 mmol/L (ref 135–145)
TCO2: 29 mmol/L (ref 0–100)
TCO2: 28 mmol/L (ref 0–100)
TCO2: 31 mmol/L (ref 0–100)
TCO2: 23 mmol/L (ref 0–100)

## 2016-07-28 LAB — URINALYSIS, ROUTINE W REFLEX MICROSCOPIC
Bilirubin Urine: NEGATIVE
GLUCOSE, UA: NEGATIVE mg/dL
Ketones, ur: NEGATIVE mg/dL
LEUKOCYTES UA: NEGATIVE
Nitrite: NEGATIVE
PROTEIN: NEGATIVE mg/dL
Specific Gravity, Urine: 1.008 (ref 1.005–1.030)
Squamous Epithelial / LPF: NONE SEEN
pH: 7 (ref 5.0–8.0)

## 2016-07-28 LAB — POCT I-STAT 3, ART BLOOD GAS (G3+)
ACID-BASE DEFICIT: 4 mmol/L — AB (ref 0.0–2.0)
Bicarbonate: 21.1 mmol/L (ref 20.0–28.0)
O2 SAT: 100 %
PCO2 ART: 31.9 mmHg — AB (ref 32.0–48.0)
PH ART: 7.419 (ref 7.350–7.450)
TCO2: 22 mmol/L (ref 0–100)
pO2, Arterial: 176 mmHg — ABNORMAL HIGH (ref 83.0–108.0)

## 2016-07-28 LAB — CBC
HEMATOCRIT: 27.1 % — AB (ref 36.0–46.0)
HEMATOCRIT: 24.2 % — AB (ref 36.0–46.0)
HEMOGLOBIN: 8.4 g/dL — AB (ref 12.0–15.0)
Hemoglobin: 9.3 g/dL — ABNORMAL LOW (ref 12.0–15.0)
MCH: 32.2 pg (ref 26.0–34.0)
MCH: 32.6 pg (ref 26.0–34.0)
MCHC: 34.3 g/dL (ref 30.0–36.0)
MCHC: 34.7 g/dL (ref 30.0–36.0)
MCV: 93.8 fL (ref 78.0–100.0)
MCV: 93.8 fL (ref 78.0–100.0)
Platelets: 120 10*3/uL — ABNORMAL LOW (ref 150–400)
Platelets: 94 10*3/uL — ABNORMAL LOW (ref 150–400)
RBC: 2.89 MIL/uL — ABNORMAL LOW (ref 3.87–5.11)
RBC: 2.58 MIL/uL — ABNORMAL LOW (ref 3.87–5.11)
RDW: 13.4 % (ref 11.5–15.5)
RDW: 13.2 % (ref 11.5–15.5)
WBC: 18.2 10*3/uL — AB (ref 4.0–10.5)
WBC: 11 10*3/uL — ABNORMAL HIGH (ref 4.0–10.5)

## 2016-07-28 LAB — GLUCOSE, CAPILLARY
GLUCOSE-CAPILLARY: 76 mg/dL (ref 65–99)
Glucose-Capillary: 127 mg/dL — ABNORMAL HIGH (ref 65–99)
Glucose-Capillary: 149 mg/dL — ABNORMAL HIGH (ref 65–99)
Glucose-Capillary: 82 mg/dL (ref 65–99)

## 2016-07-28 LAB — HEMOGLOBIN AND HEMATOCRIT, BLOOD
HCT: 25.6 % — ABNORMAL LOW (ref 36.0–46.0)
Hemoglobin: 8.9 g/dL — ABNORMAL LOW (ref 12.0–15.0)

## 2016-07-28 LAB — CREATININE, SERUM
CREATININE: 0.65 mg/dL (ref 0.44–1.00)
GFR calc Af Amer: >60 mL/min (ref >60)
GFR calc non Af Amer: >60 mL/min (ref >60)

## 2016-07-28 LAB — MAGNESIUM: Magnesium: 2.8 mg/dL — ABNORMAL HIGH (ref 1.7–2.4)

## 2016-07-28 LAB — PLATELET COUNT: Platelets: 135 10*3/uL — ABNORMAL LOW (ref 150–400)

## 2016-07-28 LAB — POCT I-STAT 4, (NA,K, GLUC, HGB,HCT)
GLUCOSE: 90 mg/dL (ref 65–99)
HEMATOCRIT: 26 % — AB (ref 36.0–46.0)
HEMOGLOBIN: 8.8 g/dL — AB (ref 12.0–15.0)
POTASSIUM: 3.5 mmol/L (ref 3.5–5.1)
SODIUM: 146 mmol/L — AB (ref 135–145)

## 2016-07-28 LAB — APTT: APTT: 42 s — AB (ref 24–36)

## 2016-07-28 SURGERY — REPAIR, MITRAL VALVE, MINIMALLY INVASIVE
Anesthesia: General | Site: Chest | Laterality: Right

## 2016-07-28 MED ORDER — PROTAMINE SULFATE 10 MG/ML IV SOLN
INTRAVENOUS | Status: DC | PRN
  Administered 2016-07-28: 150 mg via INTRAVENOUS

## 2016-07-28 MED ORDER — BUPIVACAINE 0.5 % ON-Q PUMP SINGLE CATH 400 ML
400.0000 mL | INJECTION | Status: AC
  Administered 2016-07-28: 400 mL
  Filled 2016-07-28: qty 400

## 2016-07-28 MED ORDER — 0.9 % SODIUM CHLORIDE (POUR BTL) OPTIME
TOPICAL | Status: DC | PRN
  Administered 2016-07-28: 6000 mL

## 2016-07-28 MED ORDER — SODIUM CHLORIDE 0.9 % IV SOLN
INTRAVENOUS | Status: DC
  Filled 2016-07-28: qty 2.5

## 2016-07-28 MED ORDER — SODIUM CHLORIDE 0.9 % IV SOLN
30.0000 meq | Freq: Once | INTRAVENOUS | Status: AC
  Administered 2016-07-28: 30 meq via INTRAVENOUS
  Filled 2016-07-28: qty 15

## 2016-07-28 MED ORDER — DOPAMINE-DEXTROSE 3.2-5 MG/ML-% IV SOLN
0.0000 ug/kg/min | INTRAVENOUS | Status: DC
  Administered 2016-07-28: 3 ug/kg/min via INTRAVENOUS

## 2016-07-28 MED ORDER — LACTATED RINGERS IV SOLN
INTRAVENOUS | Status: DC | PRN
  Administered 2016-07-28: 07:00:00 via INTRAVENOUS

## 2016-07-28 MED ORDER — PROPOFOL 10 MG/ML IV BOLUS
INTRAVENOUS | Status: DC | PRN
  Administered 2016-07-28: 30 mg via INTRAVENOUS

## 2016-07-28 MED ORDER — TRAMADOL HCL 50 MG PO TABS
50.0000 mg | ORAL_TABLET | ORAL | Status: DC | PRN
  Administered 2016-07-29 (×2): 50 mg via ORAL
  Filled 2016-07-28 (×2): qty 1

## 2016-07-28 MED ORDER — ONDANSETRON HCL 4 MG/2ML IJ SOLN
INTRAMUSCULAR | Status: DC | PRN
  Administered 2016-07-28: 4 mg via INTRAVENOUS

## 2016-07-28 MED ORDER — BUPIVACAINE HCL (PF) 0.5 % IJ SOLN
INTRAMUSCULAR | Status: AC
  Filled 2016-07-28: qty 10

## 2016-07-28 MED ORDER — SODIUM CHLORIDE 0.9 % IV SOLN
INTRAVENOUS | Status: DC
  Administered 2016-07-28: 15:00:00 via INTRAVENOUS

## 2016-07-28 MED ORDER — MORPHINE SULFATE (PF) 2 MG/ML IV SOLN: 1.0000 mg | INTRAVENOUS | Status: DC | PRN

## 2016-07-28 MED ORDER — HEPARIN SODIUM (PORCINE) 1000 UNIT/ML IJ SOLN
INTRAMUSCULAR | Status: DC | PRN
  Administered 2016-07-28: 17000 [IU] via INTRAVENOUS

## 2016-07-28 MED ORDER — BISACODYL 10 MG RE SUPP: 10.0000 mg | Freq: Every day | RECTAL | Status: DC

## 2016-07-28 MED ORDER — LACTATED RINGERS IV SOLN
INTRAVENOUS | Status: DC
  Administered 2016-07-28 – 2016-07-29 (×2): via INTRAVENOUS

## 2016-07-28 MED ORDER — ONDANSETRON HCL 4 MG/2ML IJ SOLN
4.0000 mg | Freq: Four times a day (QID) | INTRAMUSCULAR | Status: DC | PRN
  Administered 2016-07-28 – 2016-07-29 (×3): 4 mg via INTRAVENOUS
  Filled 2016-07-28 (×4): qty 2

## 2016-07-28 MED ORDER — MORPHINE SULFATE (PF) 2 MG/ML IV SOLN
1.0000 mg | INTRAVENOUS | Status: DC | PRN
  Administered 2016-07-29 (×2): 1 mg via INTRAVENOUS
  Filled 2016-07-28 (×2): qty 1

## 2016-07-28 MED ORDER — CHLORHEXIDINE GLUCONATE 0.12 % MT SOLN
15.0000 mL | OROMUCOSAL | Status: AC
  Administered 2016-07-28: 15 mL via OROMUCOSAL

## 2016-07-28 MED ORDER — BUPIVACAINE HCL (PF) 0.5 % IJ SOLN
INTRAMUSCULAR | Status: DC | PRN
  Administered 2016-07-28: 10 mL

## 2016-07-28 MED ORDER — NITROGLYCERIN IN D5W 200-5 MCG/ML-% IV SOLN: 0.0000 ug/min | INTRAVENOUS | Status: DC

## 2016-07-28 MED ORDER — GLYCOPYRROLATE 0.2 MG/ML IV SOSY
PREFILLED_SYRINGE | INTRAVENOUS | Status: DC | PRN
  Administered 2016-07-28 (×2): .2 mg via INTRAVENOUS

## 2016-07-28 MED ORDER — MIDAZOLAM HCL 5 MG/5ML IJ SOLN
INTRAMUSCULAR | Status: DC | PRN
  Administered 2016-07-28: 2 mg via INTRAVENOUS
  Administered 2016-07-28: 1 mg via INTRAVENOUS
  Administered 2016-07-28: 2 mg via INTRAVENOUS

## 2016-07-28 MED ORDER — CHLORHEXIDINE GLUCONATE 0.12% ORAL RINSE (MEDLINE KIT)
15.0000 mL | Freq: Two times a day (BID) | OROMUCOSAL | Status: DC
  Administered 2016-07-28: 15 mL via OROMUCOSAL

## 2016-07-28 MED ORDER — ACETAMINOPHEN 500 MG PO TABS
1000.0000 mg | ORAL_TABLET | Freq: Four times a day (QID) | ORAL | Status: AC
  Administered 2016-07-29 – 2016-08-02 (×13): 1000 mg via ORAL
  Filled 2016-07-28 (×13): qty 2

## 2016-07-28 MED ORDER — INSULIN ASPART 100 UNIT/ML ~~LOC~~ SOLN
0.0000 [IU] | SUBCUTANEOUS | Status: DC
  Administered 2016-07-28 – 2016-07-31 (×8): 2 [IU] via SUBCUTANEOUS

## 2016-07-28 MED ORDER — ALBUMIN HUMAN 5 % IV SOLN
250.0000 mL | INTRAVENOUS | Status: AC | PRN
  Administered 2016-07-28 (×4): 250 mL via INTRAVENOUS
  Filled 2016-07-28 (×2): qty 250

## 2016-07-28 MED ORDER — SODIUM CHLORIDE 0.9% FLUSH
3.0000 mL | Freq: Two times a day (BID) | INTRAVENOUS | Status: DC
  Administered 2016-07-29 – 2016-08-03 (×9): 3 mL via INTRAVENOUS

## 2016-07-28 MED ORDER — MIDAZOLAM HCL 2 MG/2ML IJ SOLN: 2.0000 mg | INTRAMUSCULAR | Status: DC | PRN

## 2016-07-28 MED ORDER — VECURONIUM BROMIDE 10 MG IV SOLR
INTRAVENOUS | Status: AC
  Filled 2016-07-28: qty 10

## 2016-07-28 MED ORDER — CHLORHEXIDINE GLUCONATE 0.12 % MT SOLN
15.0000 mL | Freq: Once | OROMUCOSAL | Status: AC
  Administered 2016-07-28: 15 mL via OROMUCOSAL
  Filled 2016-07-28: qty 15

## 2016-07-28 MED ORDER — ASPIRIN 81 MG PO CHEW: 324.0000 mg | CHEWABLE_TABLET | Freq: Every day | ORAL | Status: DC

## 2016-07-28 MED ORDER — INSULIN REGULAR BOLUS VIA INFUSION
0.0000 [IU] | Freq: Three times a day (TID) | INTRAVENOUS | Status: DC
  Filled 2016-07-28: qty 10

## 2016-07-28 MED ORDER — LACTATED RINGERS IV SOLN
500.0000 mL | Freq: Once | INTRAVENOUS | Status: AC | PRN
  Administered 2016-07-28: 500 mL via INTRAVENOUS

## 2016-07-28 MED ORDER — METOPROLOL TARTRATE 12.5 MG HALF TABLET: 12.5000 mg | ORAL_TABLET | Freq: Two times a day (BID) | ORAL | Status: DC

## 2016-07-28 MED ORDER — SUCCINYLCHOLINE CHLORIDE 200 MG/10ML IV SOSY
PREFILLED_SYRINGE | INTRAVENOUS | Status: DC | PRN
  Administered 2016-07-28: 120 mg via INTRAVENOUS

## 2016-07-28 MED ORDER — ACETAMINOPHEN 160 MG/5ML PO SOLN: 650.0000 mg | Freq: Once | ORAL | Status: AC

## 2016-07-28 MED ORDER — HEPARIN SODIUM (PORCINE) 1000 UNIT/ML IJ SOLN
INTRAMUSCULAR | Status: AC
  Filled 2016-07-28: qty 1

## 2016-07-28 MED ORDER — SODIUM CHLORIDE 0.9 % IJ SOLN
INTRAMUSCULAR | Status: AC
  Filled 2016-07-28: qty 10

## 2016-07-28 MED ORDER — ACETAMINOPHEN 160 MG/5ML PO SOLN: 1000.0000 mg | Freq: Four times a day (QID) | ORAL | Status: DC

## 2016-07-28 MED ORDER — MIDAZOLAM HCL 10 MG/2ML IJ SOLN
INTRAMUSCULAR | Status: AC
  Filled 2016-07-28: qty 2

## 2016-07-28 MED ORDER — LEVOTHYROXINE SODIUM 75 MCG PO TABS
75.0000 ug | ORAL_TABLET | Freq: Every day | ORAL | Status: DC
  Filled 2016-07-28: qty 1

## 2016-07-28 MED ORDER — LACTATED RINGERS IV SOLN
INTRAVENOUS | Status: DC
  Administered 2016-07-28: 15:00:00 via INTRAVENOUS

## 2016-07-28 MED ORDER — SUCCINYLCHOLINE CHLORIDE 200 MG/10ML IV SOSY
PREFILLED_SYRINGE | INTRAVENOUS | Status: AC
  Filled 2016-07-28: qty 10

## 2016-07-28 MED ORDER — PHENYLEPHRINE HCL 10 MG/ML IJ SOLN
0.0000 ug/min | INTRAMUSCULAR | Status: DC
  Administered 2016-07-28: 40 ug/min via INTRAVENOUS
  Administered 2016-07-29: 50 ug/min via INTRAVENOUS
  Filled 2016-07-28 (×3): qty 2

## 2016-07-28 MED ORDER — PANTOPRAZOLE SODIUM 40 MG PO TBEC
40.0000 mg | DELAYED_RELEASE_TABLET | Freq: Every day | ORAL | Status: DC
  Administered 2016-07-30 – 2016-08-04 (×6): 40 mg via ORAL
  Filled 2016-07-28 (×6): qty 1

## 2016-07-28 MED ORDER — DOCUSATE SODIUM 100 MG PO CAPS
200.0000 mg | ORAL_CAPSULE | Freq: Every day | ORAL | Status: DC
  Administered 2016-07-30 – 2016-08-02 (×3): 200 mg via ORAL
  Filled 2016-07-28 (×4): qty 2

## 2016-07-28 MED ORDER — SODIUM CHLORIDE 0.45 % IV SOLN: INTRAVENOUS | Status: DC | PRN

## 2016-07-28 MED ORDER — ASPIRIN EC 325 MG PO TBEC: 325.0000 mg | DELAYED_RELEASE_TABLET | Freq: Every day | ORAL | Status: DC

## 2016-07-28 MED ORDER — DEXTROSE 5 % IV SOLN
1.5000 g | Freq: Two times a day (BID) | INTRAVENOUS | Status: AC
  Administered 2016-07-28 – 2016-07-30 (×4): 1.5 g via INTRAVENOUS
  Filled 2016-07-28 (×4): qty 1.5

## 2016-07-28 MED ORDER — PHENYLEPHRINE HCL 10 MG/ML IJ SOLN
INTRAVENOUS | Status: DC | PRN
  Administered 2016-07-28: 25 ug/min via INTRAVENOUS

## 2016-07-28 MED ORDER — ROCURONIUM BROMIDE 10 MG/ML (PF) SYRINGE
PREFILLED_SYRINGE | INTRAVENOUS | Status: DC | PRN
  Administered 2016-07-28: 10 mg via INTRAVENOUS
  Administered 2016-07-28: 40 mg via INTRAVENOUS

## 2016-07-28 MED ORDER — METOPROLOL TARTRATE 5 MG/5ML IV SOLN
2.5000 mg | INTRAVENOUS | Status: DC | PRN
  Filled 2016-07-28 (×2): qty 5

## 2016-07-28 MED ORDER — MAGNESIUM SULFATE 4 GM/100ML IV SOLN
4.0000 g | Freq: Once | INTRAVENOUS | Status: AC
  Administered 2016-07-28: 4 g via INTRAVENOUS
  Filled 2016-07-28: qty 100

## 2016-07-28 MED ORDER — PROPOFOL 10 MG/ML IV BOLUS
INTRAVENOUS | Status: AC
  Filled 2016-07-28: qty 20

## 2016-07-28 MED ORDER — SODIUM CHLORIDE 0.9 % IV SOLN
INTRAVENOUS | Status: DC | PRN
  Administered 2016-07-28: 14:00:00 via INTRAVENOUS

## 2016-07-28 MED ORDER — EPHEDRINE SULFATE-NACL 50-0.9 MG/10ML-% IV SOSY
PREFILLED_SYRINGE | INTRAVENOUS | Status: DC | PRN
  Administered 2016-07-28 (×3): 5 mg via INTRAVENOUS

## 2016-07-28 MED ORDER — FENTANYL CITRATE (PF) 250 MCG/5ML IJ SOLN
INTRAMUSCULAR | Status: DC | PRN
  Administered 2016-07-28: 100 ug via INTRAVENOUS
  Administered 2016-07-28: 650 ug via INTRAVENOUS
  Administered 2016-07-28: 100 ug via INTRAVENOUS
  Administered 2016-07-28 (×2): 50 ug via INTRAVENOUS

## 2016-07-28 MED ORDER — ROCURONIUM BROMIDE 50 MG/5ML IV SOSY
PREFILLED_SYRINGE | INTRAVENOUS | Status: AC
  Filled 2016-07-28: qty 5

## 2016-07-28 MED ORDER — VECURONIUM BROMIDE 10 MG IV SOLR
INTRAVENOUS | Status: DC | PRN
  Administered 2016-07-28 (×3): 5 mg via INTRAVENOUS
  Administered 2016-07-28: 2 mg via INTRAVENOUS

## 2016-07-28 MED ORDER — FAMOTIDINE IN NACL 20-0.9 MG/50ML-% IV SOLN
20.0000 mg | Freq: Two times a day (BID) | INTRAVENOUS | Status: AC
  Administered 2016-07-28 (×2): 20 mg via INTRAVENOUS
  Filled 2016-07-28: qty 50

## 2016-07-28 MED ORDER — ALBUMIN HUMAN 5 % IV SOLN
INTRAVENOUS | Status: DC | PRN
  Administered 2016-07-28 (×3): via INTRAVENOUS

## 2016-07-28 MED ORDER — ARTIFICIAL TEARS OP OINT
TOPICAL_OINTMENT | OPHTHALMIC | Status: DC | PRN
  Administered 2016-07-28: 1 via OPHTHALMIC

## 2016-07-28 MED ORDER — PROTAMINE SULFATE 10 MG/ML IV SOLN
INTRAVENOUS | Status: AC
  Filled 2016-07-28: qty 25

## 2016-07-28 MED ORDER — ALBUMIN HUMAN 5 % IV SOLN
250.0000 mL | INTRAVENOUS | Status: AC | PRN
  Administered 2016-07-28 (×2): 250 mL via INTRAVENOUS
  Filled 2016-07-28 (×2): qty 250

## 2016-07-28 MED ORDER — ACETAMINOPHEN 650 MG RE SUPP
650.0000 mg | Freq: Once | RECTAL | Status: AC
  Administered 2016-07-28: 650 mg via RECTAL

## 2016-07-28 MED ORDER — BISACODYL 5 MG PO TBEC
10.0000 mg | DELAYED_RELEASE_TABLET | Freq: Every day | ORAL | Status: DC
  Administered 2016-07-30 – 2016-08-02 (×3): 10 mg via ORAL
  Filled 2016-07-28 (×4): qty 2

## 2016-07-28 MED ORDER — METOPROLOL TARTRATE 12.5 MG HALF TABLET
12.5000 mg | ORAL_TABLET | Freq: Once | ORAL | Status: AC
  Administered 2016-07-28: 12.5 mg via ORAL
  Filled 2016-07-28: qty 1

## 2016-07-28 MED ORDER — FENTANYL CITRATE (PF) 250 MCG/5ML IJ SOLN
INTRAMUSCULAR | Status: AC
  Filled 2016-07-28: qty 25

## 2016-07-28 MED ORDER — VANCOMYCIN HCL IN DEXTROSE 1-5 GM/200ML-% IV SOLN
1000.0000 mg | Freq: Once | INTRAVENOUS | Status: AC
  Administered 2016-07-28: 1000 mg via INTRAVENOUS
  Filled 2016-07-28: qty 200

## 2016-07-28 MED ORDER — VECURONIUM BROMIDE 10 MG IV SOLR
INTRAVENOUS | Status: AC
  Filled 2016-07-28: qty 20

## 2016-07-28 MED ORDER — ORAL CARE MOUTH RINSE
15.0000 mL | OROMUCOSAL | Status: DC
  Administered 2016-07-28 (×3): 15 mL via OROMUCOSAL

## 2016-07-28 MED ORDER — EPHEDRINE 5 MG/ML INJ
INTRAVENOUS | Status: AC
  Filled 2016-07-28: qty 10

## 2016-07-28 MED ORDER — DEXMEDETOMIDINE HCL IN NACL 200 MCG/50ML IV SOLN: 0.0000 ug/kg/h | INTRAVENOUS | Status: DC

## 2016-07-28 MED ORDER — SODIUM CHLORIDE 0.9 % IV SOLN: 250.0000 mL | INTRAVENOUS | Status: DC

## 2016-07-28 MED ORDER — SODIUM CHLORIDE 0.9% FLUSH: 3.0000 mL | INTRAVENOUS | Status: DC | PRN

## 2016-07-28 MED ORDER — METOPROLOL TARTRATE 25 MG/10 ML ORAL SUSPENSION: 12.5000 mg | Freq: Two times a day (BID) | ORAL | Status: DC

## 2016-07-28 MED ORDER — OXYCODONE HCL 5 MG PO TABS: 5.0000 mg | ORAL_TABLET | ORAL | Status: DC | PRN

## 2016-07-28 MED FILL — Heparin Sodium (Porcine) Inj 1000 Unit/ML: INTRAMUSCULAR | Qty: 30 | Status: AC

## 2016-07-28 MED FILL — Magnesium Sulfate Inj 50%: INTRAMUSCULAR | Qty: 10 | Status: AC

## 2016-07-28 MED FILL — Potassium Chloride Inj 2 mEq/ML: INTRAVENOUS | Qty: 40 | Status: AC

## 2016-07-28 SURGICAL SUPPLY — 105 items
ADAPTER CARDIO PERF ANTE/RETRO (ADAPTER) ×3 IMPLANT
ADAPTER DLP PERFUSION .25INX2I (MISCELLANEOUS) ×2 IMPLANT
ADH SKN CLS APL DERMABOND .7 (GAUZE/BANDAGES/DRESSINGS) ×4
ADPR CRDPLG .25X.64 STRL (MISCELLANEOUS) ×4
ADPR PRFSN 84XANTGRD RTRGD (ADAPTER) ×2
BAG DECANTER FOR FLEXI CONT (MISCELLANEOUS) ×5 IMPLANT
BLADE SURG 11 STRL SS (BLADE) ×3 IMPLANT
CANISTER SUCT 3000ML PPV (MISCELLANEOUS) ×5 IMPLANT
CANNULA FEM VENOUS REMOTE 22FR (CANNULA) ×1 IMPLANT
CANNULA FEMORAL ART 14 SM (MISCELLANEOUS) ×3 IMPLANT
CANNULA GUNDRY RCSP 15FR (MISCELLANEOUS) ×3 IMPLANT
CANNULA OPTISITE PERFUSION 16F (CANNULA) ×1 IMPLANT
CANNULA OPTISITE PERFUSION 18F (CANNULA) IMPLANT
CANNULA SUMP PERICARDIAL (CANNULA) ×6 IMPLANT
CATH KIT ON Q 5IN SLV (PAIN MANAGEMENT) IMPLANT
CATH KIT ON-Q SILVERSOAK 5 (CATHETERS) IMPLANT
CATH KIT ON-Q SILVERSOAK 5IN (CATHETERS) ×6 IMPLANT
CONN ST 1/4X3/8  BEN (MISCELLANEOUS) ×2
CONN ST 1/4X3/8 BEN (MISCELLANEOUS) ×4 IMPLANT
CONNECTOR 1/2X3/8X1/2 3 WAY (MISCELLANEOUS) ×1
CONNECTOR 1/2X3/8X1/2 3WAY (MISCELLANEOUS) ×2 IMPLANT
CONT SPEC 4OZ CLIKSEAL STRL BL (MISCELLANEOUS) ×2 IMPLANT
CONT SPEC STER OR (MISCELLANEOUS) ×2 IMPLANT
COVER BACK TABLE 24X17X13 BIG (DRAPES) ×3 IMPLANT
COVER MAYO STAND STRL (DRAPES) ×2 IMPLANT
COVER TRANSDUCER ULTRASND GEL (DRAPE) ×1 IMPLANT
CRADLE DONUT ADULT HEAD (MISCELLANEOUS) ×3 IMPLANT
DERMABOND ADVANCED (GAUZE/BANDAGES/DRESSINGS) ×2
DERMABOND ADVANCED .7 DNX12 (GAUZE/BANDAGES/DRESSINGS) ×4 IMPLANT
DEVICE PMI PUNCTURE CLOSURE (MISCELLANEOUS) ×3 IMPLANT
DEVICE SUT CK QUICK LOAD MINI (Prosthesis & Implant Heart) ×2 IMPLANT
DEVICE TROCAR PUNCTURE CLOSURE (ENDOMECHANICALS) ×3 IMPLANT
DRAIN CHANNEL 28F RND 3/8 FF (WOUND CARE) ×6 IMPLANT
DRAPE BILATERAL SPLIT (DRAPES) ×3 IMPLANT
DRAPE C-ARM 42X72 X-RAY (DRAPES) ×3 IMPLANT
DRAPE CV SPLIT W-CLR ANES SCRN (DRAPES) ×3 IMPLANT
DRAPE INCISE IOBAN 66X45 STRL (DRAPES) ×9 IMPLANT
DRAPE SLUSH/WARMER DISC (DRAPES) ×3 IMPLANT
DRSG COVADERM 4X8 (GAUZE/BANDAGES/DRESSINGS) ×3 IMPLANT
ELECT BLADE 6.5 EXT (BLADE) ×3 IMPLANT
ELECT REM PT RETURN 9FT ADLT (ELECTROSURGICAL) ×6
ELECTRODE REM PT RTRN 9FT ADLT (ELECTROSURGICAL) ×4 IMPLANT
FELT TEFLON 1X6 (MISCELLANEOUS) ×5 IMPLANT
FEMORAL VENOUS CANN RAP (CANNULA) IMPLANT
GAUZE SPONGE 4X4 12PLY STRL (GAUZE/BANDAGES/DRESSINGS) ×1 IMPLANT
GLOVE ORTHO TXT STRL SZ7.5 (GLOVE) ×9 IMPLANT
GOWN STRL REUS W/ TWL LRG LVL3 (GOWN DISPOSABLE) ×8 IMPLANT
GOWN STRL REUS W/TWL LRG LVL3 (GOWN DISPOSABLE) ×30
KIT BASIN OR (CUSTOM PROCEDURE TRAY) ×3 IMPLANT
KIT DILATOR VASC 18G NDL (KITS) ×3 IMPLANT
KIT DRAINAGE VACCUM ASSIST (KITS) ×1 IMPLANT
KIT ROOM TURNOVER OR (KITS) ×3 IMPLANT
KIT SUCTION CATH 14FR (SUCTIONS) ×3 IMPLANT
KIT SUT CK MINI COMBO 4X17 (Prosthesis & Implant Heart) ×1 IMPLANT
LEAD PACING MYOCARDI (MISCELLANEOUS) ×3 IMPLANT
LINE VENT (MISCELLANEOUS) ×1 IMPLANT
NDL AORTIC ROOT 14G 7F (CATHETERS) ×2 IMPLANT
NEEDLE AORTIC ROOT 14G 7F (CATHETERS) ×3 IMPLANT
NS IRRIG 1000ML POUR BTL (IV SOLUTION) ×16 IMPLANT
PACK OPEN HEART (CUSTOM PROCEDURE TRAY) ×3 IMPLANT
PAD ARMBOARD 7.5X6 YLW CONV (MISCELLANEOUS) ×6 IMPLANT
PAD ELECT DEFIB RADIOL ZOLL (MISCELLANEOUS) ×3 IMPLANT
RING MITRAL MEMO 3D 30MM SMD30 (Prosthesis & Implant Heart) ×1 IMPLANT
SET CANNULATION TOURNIQUET (MISCELLANEOUS) ×3 IMPLANT
SET CARDIOPLEGIA MPS 5001102 (MISCELLANEOUS) ×1 IMPLANT
SET IRRIG TUBING LAPAROSCOPIC (IRRIGATION / IRRIGATOR) ×3 IMPLANT
SOLUTION ANTI FOG 6CC (MISCELLANEOUS) ×3 IMPLANT
SPONGE GAUZE 4X4 12PLY STER LF (GAUZE/BANDAGES/DRESSINGS) ×3 IMPLANT
SUT BONE WAX W31G (SUTURE) ×3 IMPLANT
SUT E-PACK MINIMALLY INVASIVE (SUTURE) ×3 IMPLANT
SUT ETHIBOND (SUTURE) ×2 IMPLANT
SUT ETHIBOND 2 0 SH (SUTURE) ×1 IMPLANT
SUT ETHIBOND 2 0 V4 (SUTURE) ×2 IMPLANT
SUT ETHIBOND 2 0V4 GREEN (SUTURE) ×2 IMPLANT
SUT ETHIBOND 2-0 RB-1 WHT (SUTURE) ×2 IMPLANT
SUT ETHIBOND X763 2 0 SH 1 (SUTURE) ×2 IMPLANT
SUT GORETEX CV 4 TH 22 36 (SUTURE) ×3 IMPLANT
SUT GORETEX CV-5THC-13 36IN (SUTURE) ×10 IMPLANT
SUT GORETEX CV4 TH-18 (SUTURE) ×6 IMPLANT
SUT PROLENE 3 0 SH1 36 (SUTURE) ×14 IMPLANT
SUT PROLENE 4 0 RB 1 (SUTURE) ×3
SUT PROLENE 4-0 RB1 .5 CRCL 36 (SUTURE) IMPLANT
SUT PTFE CHORD X 20MM (SUTURE) ×1 IMPLANT
SUT SILK  1 MH (SUTURE) ×1
SUT SILK 1 MH (SUTURE) IMPLANT
SUT SILK 2 0 SH CR/8 (SUTURE) IMPLANT
SUT SILK 3 0 SH CR/8 (SUTURE) IMPLANT
SUT SILK 3 0 TIES 10X30 (SUTURE) ×1 IMPLANT
SUT VIC AB 2-0 CTX 36 (SUTURE) IMPLANT
SUT VIC AB 3-0 SH 8-18 (SUTURE) ×1 IMPLANT
SUT VICRYL 2 TP 1 (SUTURE) IMPLANT
SYR 10ML LL (SYRINGE) ×3 IMPLANT
SYSTEM SAHARA CHEST DRAIN ATS (WOUND CARE) ×3 IMPLANT
TOWEL GREEN STERILE (TOWEL DISPOSABLE) ×9 IMPLANT
TOWEL GREEN STERILE FF (TOWEL DISPOSABLE) ×4 IMPLANT
TOWEL OR 17X24 6PK STRL BLUE (TOWEL DISPOSABLE) ×4 IMPLANT
TOWEL OR 17X26 10 PK STRL BLUE (TOWEL DISPOSABLE) ×4 IMPLANT
TRAY FOLEY IC TEMP SENS 16FR (CATHETERS) ×3 IMPLANT
TROCAR XCEL BLADELESS 5X75MML (TROCAR) ×3 IMPLANT
TROCAR XCEL NON-BLD 11X100MML (ENDOMECHANICALS) ×6 IMPLANT
TUBE SUCT INTRACARD DLP 20F (MISCELLANEOUS) ×3 IMPLANT
TUNNELER SHEATH ON-Q 11GX8 DSP (PAIN MANAGEMENT) ×2 IMPLANT
UNDERPAD 30X30 (UNDERPADS AND DIAPERS) ×3 IMPLANT
WATER STERILE IRR 1000ML POUR (IV SOLUTION) ×6 IMPLANT
WIRE .035 3MM-J 145CM (WIRE) ×3 IMPLANT

## 2016-07-28 NOTE — Anesthesia Procedure Notes (Signed)
Procedure Name: Intubation Date/Time: 07/28/2016 2:22 PM Performed by: Annabelle HarmanSMITH, Desiray Orchard A Pre-anesthesia Checklist: Patient identified, Emergency Drugs available, Suction available and Patient being monitored Patient Re-evaluated:Patient Re-evaluated prior to inductionPreoxygenation: Pre-oxygenation with 100% oxygen Laryngoscope Size: Glidescope and 3 Grade View: Grade I Tube type: Oral Tube size: 7.0 mm Number of attempts: 1 Airway Equipment and Method: Video-laryngoscopy Placement Confirmation: ETT inserted through vocal cords under direct vision,  positive ETCO2 and breath sounds checked- equal and bilateral Secured at: 22 cm Tube secured with: Tape Dental Injury: Teeth and Oropharynx as per pre-operative assessment  Comments: L DLT exchnaged for 7.0 ETT via exchange catheter.  Glidescope 3 used.

## 2016-07-28 NOTE — Brief Op Note (Addendum)
07/28/2016  1:06 PM  PATIENT:  Hannah SaucierJanet M Fitzgerald  71 y.o. 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

## 2016-07-28 NOTE — Op Note (Signed)
CARDIOTHORACIC SURGERY OPERATIVE NOTE  Date of Procedure:  07/28/2016  Preoperative Diagnosis: Severe Mitral Regurgitation  Postoperative Diagnosis: Same  Procedure:    Minimally-Invasive Mitral Valve Repair  Complex valvuloplasty including quadrangular resection of flail segment of posterior leaflet  Artificial Gore-tex neochord placement x6  Sorin Memo 3D Ring Annuloplasty (size 30 mm, catalog # [id number], serial # [id number])    Surgeon: Salvatore Decent. 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 The patient states that she was told that she had mitral valve prolapse many years ago. She has never been formally evaluated by a cardiologist until recently. In 2006 the patient was hospitalized briefly at [facility] what was felt to be a possible embolic event to her right thumb. Prior to that the patient had reportedly experienced a similar episode of sudden onset of blue discoloration to the third finger of the right hand several months previously for which she has not seen medical attention. 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 She was started on Coumadin and transferred to [facility] in  [place]. She was felt to have suffered a likely embolic event. According to her recollection no echocardiogram was done. 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 She has been on chronic warfarin anticoagulation ever since. The patient has not had any further episodes of possible embolic events and she has no documented history of atrial fibrillation. She recently complained to her primary care physician that she noted that if she climbed a flight of stairs she would note that her heart was pounding and racing faster than she thought appropriate. She denied any symptoms of exertional shortness of breath. Her primary care physician noted a loud holosystolic murmur on physical exam and referred her for a transthoracic echocardiogram and cardiology evaluation. Echocardiogram performed 04/04/2016 in [place] was found to reveal normal left ventricular systolic function with mitral valve prolapse and "moderate mitral regurgitation". There was severe left atrial enlargement. No other significant abnormalities were noted. The patient subsequently underwent left and right heart catheterization by Dr. Effie Shy 06/08/2016. The patient was found to have normal coronary arteries with normal left ventricular systolic function and normal right heart pressures. There was severe mitral regurgitation appreciated at catheterization. The patient was referred for elective surgical consultation.  The patient has been seen in consultation and counseled at length regarding the indications, risks and potential benefits of surgery.  All questions have been answered, and the patient provides full informed consent for the operation as described.    DETAILS OF THE OPERATIVE PROCEDURE  Preparation:  The patient is brought to the operating room on the above mentioned date and central monitoring was established by the anesthesia team including placement of Swan-Ganz  catheter through the left internal jugular vein.  A  radial arterial line is placed. The patient is placed in the supine position on the operating table.  Intravenous antibiotics are administered. General endotracheal anesthesia is induced uneventfully. The patient is initially intubated using a dual lumen endotracheal tube.  A Foley catheter is placed.  Baseline transesophageal echocardiogram was performed.  Findings were notable for myxomatous degenerative disease of the mitral valve with a large flail segment involving the middle scallop of the posterior leaflet. There was severe prolapse causing an eccentric jet of regurgitation directed anteriorly. There was severe mitral regurgitation. There was normal left ventricular systolic function. There was mild central aortic insufficiency.  A soft roll is placed behind the patient's left scapula and the neck gently extended and turned to the left.   The patient's right neck, chest, abdomen, both groins, and both lower extremities are prepared and draped in a sterile manner. A time out procedure is performed.  Surgical Approach:  A right miniature anterolateral thoracotomy incision is performed. The incision is placed just lateral to and superior to the right nipple. The pectoralis major muscle is retracted medially and completely preserved. The right pleural space is entered through the 4th intercostal space. A soft tissue retractor is placed.  Two 11 mm ports are placed through separate stab incisions inferiorly. The right pleural space is insufflated continuously with carbon dioxide gas through the posterior port during the remainder of the operation.  A pledgeted sutures placed through the dome of the right hemidiaphragm and retracted inferiorly to facilitate exposure.  A longitudinal incision is made in the pericardium 3 cm anterior to the phrenic nerve and silk traction sutures are placed on either side of the incision for exposure.   Extracorporeal  Cardiopulmonary Bypass and Myocardial Protection:  A small incision is made in the right inguinal crease and the anterior surface of the right common femoral artery and right common femoral vein are identified.  The patient is placed in Trendelenburg position. The right internal jugular vein is cannulated with Seldinger technique and a guidewire advanced into the right atrium. The patient is heparinized systemically. The right internal jugular vein is cannulated with a 14 [place] pediatric femoral venous cannula. Pursestring sutures are placed on the anterior surface of the right common femoral vein and right common femoral artery. The right common femoral vein is cannulated with the Seldinger technique and a guidewire is advanced under transesophageal echocardiogram guidance through the right atrium. The femoral vein is cannulated with a long 22 French femoral venous cannula. The right common femoral artery is cannulated with Seldinger technique and a flexible guidewire is advanced until it can be appreciated intraluminally in the descending thoracic aorta on transesophageal echocardiogram. The femoral artery is cannulated with an 18 French femoral arterial cannula.  Adequate heparinization is verified.     The entire pre-bypass portion of the operation was notable for stable hemodynamics.  Cardiopulmonary bypass was begun.  Vacuum assist venous drainage is utilized. The incision in the pericardium is extended in both directions. Venous drainage and exposure are notably excellent. A retrograde cardioplegia cannula is placed through the right atrium into the coronary sinus using transesophageal echocardiogram guidance.  An antegrade cardioplegia cannula is placed in the ascending aorta.    The patient is cooled to 28C systemic temperature.  The aortic cross clamp is applied and cold blood cardioplegia is delivered initially in an antegrade fashion through the aortic root.   Supplemental cardioplegia is given  retrograde through the coronary sinus catheter. The initial cardioplegic arrest is rapid  with early diastolic arrest.  Repeat doses of cardioplegia are administered intermittently every 20 to 30 minutes throughout the entire cross clamp portion of the operation through the aortic root and through the coronary sinus catheter in order to maintain completely flat electrocardiogram.  Myocardial protection was felt to be excellent.   Mitral Valve Repair:  A left atriotomy incision was performed through the interatrial groove and extended partially across the back wall of the left atrium after opening the oblique sinus inferiorly.  The mitral valve is exposed using a self-retaining retractor.  The mitral valve was inspected and notable for fibroelastic deficiency type degenerative disease with a large flail segment involving the middle scallop (P2) of the posterior leaflet. There were multiple ruptured primary chordae tendineae and the remainder of the chordae tendineae to P2 were elongated. There was significant redundant tissue in P2.  The anterior leaflet appeared essentially normal. P1 and P3 were essentially normal. Patient had relatively thin tissue in P1 and P3. There was no calcification.  Interrupted 2-0 Ethibond horizontal mattress sutures are placed circumferentially around the entire mitral valve annulus. The sutures will ultimately be utilized for ring annuloplasty, and at this juncture there are utilized to suspend the valve symmetrically.  The flail segment of the posterior leaflet was repaired using a quadrangular resection. Approximately 35% of the total surface area of P2 was resected. A single figure-of-eight 2-0 Ethibond horizontal mattress compression suture was placed at the base of the resection to close the posterior annulus. The intervening vertical defect in P2 was closed using interrupted everting simple CV 5 Gore-Tex suture.  Artificial neochord placement was performed using Chord-X  multi-strand CV-4 Goretex pre-measured loops.  The appropriate cord length was measured from corresponding normal length primary cords from the P1 segment of the posterior leaflet. The papillary muscle suture of the Chord-X multi-strand suture was placed through the head of the posterior papillary muscle in a horizontal mattress fashion and tied over Teflon felt pledgets. Each of the three pre-measured loops were then reimplanted into the free margin of the P2 segment of the posterior leaflet.    The valve was tested with saline and appeared competent even without ring annuloplasty complete. The valve was sized to a 30 mm annuloplasty ring, based upon the transverse distance between the left and right commissures and the height of the anterior leaflet, corresponding to a size just slightly larger than the overall surface area of the anterior leaflet.  A Sorin Memo 3D annuloplasty ring (size 30 mm, catalog #[id number], serial [id number]) was secured in place uneventfully. All ring sutures were secured using a Cor-knot device.  The valve was tested with saline and appeared competent.   The valve is again tested with saline and appears to be perfectly competent with a broad symmetrical line of coaptation of the anterior and posterior leaflet. There is no residual leak. There was a broad, symmetrical line of coaptation of the anterior and posterior leaflet which was confirmed using the blue ink test.  Rewarming is begun.   Procedure Completion:  The atriotomy was closed using a 2-layer closure of running 3-0 Prolene suture after placing a sump drain across the mitral valve to serve as a left ventricular vent.  One final dose of warm retrograde "hot shot" cardioplegia was administered retrograde through the coronary sinus catheter while all air was evacuated through the aortic root.  The aortic cross clamp was removed after a total cross clamp time of 108 minutes.  Epicardial pacing wires are fixed  to the inferior  wall of the right ventricule and to the right atrial appendage. The patient is rewarmed to 37C temperature. The left ventricular vent is removed.  The patient is ventilated and flow volumes turndown while the mitral valve repair is inspected using transesophageal echocardiogram. The valve repair appears intact with no residual leak. The antegrade cardioplegia cannula is now removed. The patient is weaned and disconnected from cardiopulmonary bypass.  The patient's rhythm at separation from bypass was AV paced.  The patient was weaned from bypass without any inotropic support. Total cardiopulmonary bypass time for the operation was 165 minutes.  Followup transesophageal echocardiogram performed after separation from bypass revealed a well-seated annuloplasty ring in the mitral position with a normal functioning mitral valve. There was no residual leak.  Left ventricular function was unchanged from preoperatively.  The mean gradient across the mitral valve was estimated to be 4 mmHg.  The femoral arterial and venous cannulae were removed uneventfully. There was a palpable pulse in the distal right common femoral artery after removal of the cannula. Protamine was administered to reverse the anticoagulation. The right internal jugular cannula was removed and manual pressure held on the neck for 15 minutes.  Single lung ventilation was begun. The atriotomy closure was inspected for hemostasis. The pericardial sac was drained using a 28 French Bard drain placed through the anterior port incision.  The pericardium was closed using a patch of core matrix bovine submucosal tissue patch. The right pleural space is irrigated with saline solution and inspected for hemostasis. The right pleural space was drained using a 28 French Bard drain placed through the posterior port incision. The miniature thoracotomy incision was closed in multiple layers in routine fashion. The right groin incision was inspected for hemostasis and  closed in multiple layers in routine fashion.  The post-bypass portion of the operation was notable for stable rhythm and hemodynamics.  No blood products were administered during the operation.   Disposition:  The patient tolerated the procedure well.  The patient was reintubated using a single lumen endotracheal tube and subsequently transported to the surgical intensive care unit in stable condition. There were no intraoperative complications. All sponge instrument and needle counts are verified correct at completion of the operation.     Salvatore Decent. Cornelius Moras MD 07/28/2016 2:08 PM

## 2016-07-28 NOTE — Anesthesia Preprocedure Evaluation (Signed)
Anesthesia Evaluation  Patient identified by MRN, date of birth, ID band Patient awake    Reviewed: Allergy & Precautions, NPO status , Patient's Chart, lab work & pertinent test results  Airway Mallampati: I  TM Distance: >3 FB Neck ROM: Full    Dental   Pulmonary former smoker,    Pulmonary exam normal        Cardiovascular Normal cardiovascular exam+ Valvular Problems/Murmurs MR      Neuro/Psych    GI/Hepatic   Endo/Other    Renal/GU      Musculoskeletal   Abdominal   Peds  Hematology   Anesthesia Other Findings   Reproductive/Obstetrics                             Anesthesia Physical Anesthesia Plan  ASA: III  Anesthesia Plan: General   Post-op Pain Management:    Induction: Intravenous  Airway Management Planned: Double Lumen EBT  Additional Equipment: Arterial line, CVP, PA Cath and Ultrasound Guidance Line Placement  Intra-op Plan:   Post-operative Plan: Post-operative intubation/ventilation  Informed Consent: I have reviewed the patients History and Physical, chart, labs and discussed the procedure including the risks, benefits and alternatives for the proposed anesthesia with the patient or authorized representative who has indicated his/her understanding and acceptance.     Plan Discussed with: CRNA and Surgeon  Anesthesia Plan Comments:         Anesthesia Quick Evaluation

## 2016-07-28 NOTE — Transfer of Care (Signed)
Immediate Anesthesia Transfer of Care Note  Patient: Hannah Fitzgerald  Procedure(s) Performed: Procedure(s): MINIMALLY INVASIVE MITRAL VALVE REPAIR (MVR) using a 30 Sorin Memo 3D Mitral Valve Ring (Right) TRANSESOPHAGEAL ECHOCARDIOGRAM (TEE) (N/A)  Patient Location: SICU  Anesthesia Type:General  Level of Consciousness: patient sedation, unable to respond to commands, remains intubated per anesthesia plan  Airway & Oxygen Therapy: Patient remains intubated per anesthesia plan and Patient placed on Ventilator (see vital sign flow sheet for setting)  Post-op Assessment: Report given to RN and Post -op Vital signs reviewed and stable  Post vital signs: Reviewed and stable  Last Vitals:  Vitals:   07/28/16 0600  BP: (!) 151/64  Pulse: 74  Resp: 18  Temp: 36.6 C    Last Pain: There were no vitals filed for this visit.    Patients Stated Pain Goal: 6 (07/28/16 0600)  Complications: No apparent anesthesia complications

## 2016-07-28 NOTE — Anesthesia Postprocedure Evaluation (Signed)
Anesthesia Post Note  Patient: [facility]et M [facility]  Procedure(s) Performed: Procedure(s) (LRB): MINIMALLY INVASIVE MITRAL VALVE REPAIR (MVR) using a 30 Sorin Memo 3D Mitral Valve Ring (Right) TRANSESOPHAGEAL ECHOCARDIOGRAM (TEE) (N/A)  Patient location during evaluation: SICU Anesthesia Type: General Level of consciousness: sedated Pain management: pain level controlled Vital Signs Assessment: post-procedure vital signs reviewed and stable Respiratory status: patient remains intubated per anesthesia plan Cardiovascular status: stable Anesthetic complications: no       Last Vitals:  Vitals:   07/28/16 0600 07/28/16 1453  BP: (!) 151/64 117/84  Pulse: 74 90  Resp: 18 12  Temp: 36.6 C     Last Pain: There were no vitals filed for this visit.               Hannah Fitzgerald DAVID

## 2016-07-28 NOTE — Anesthesia Procedure Notes (Addendum)
Central Venous Catheter Insertion Performed by: Arta BruceSSEY, Jaylee Lantry, anesthesiologist Start/End3/05/2016 7:05 AM, 07/28/2016 7:15 AM Preanesthetic checklist: patient identified, IV checked, risks and benefits discussed, surgical consent, monitors and equipment checked, pre-op evaluation, timeout performed and anesthesia consent Position: Trendelenburg Lidocaine 1% used for infiltration and patient sedated Hand hygiene performed , maximum sterile barriers used  and Seldinger technique used Central line and PA cath was placed.Sheath introducer Swan type:thermodilution Procedure performed using ultrasound guided technique. Ultrasound Notes:anatomy identified, needle tip was noted to be adjacent to the nerve/plexus identified, no ultrasound evidence of intravascular and/or intraneural injection and image(s) printed for medical record Attempts: 1 Following insertion, line sutured. Post procedure assessment: blood return through all ports, free fluid flow and no air  Patient tolerated the procedure well with no immediate complications.

## 2016-07-28 NOTE — Progress Notes (Signed)
Patient ID: Hannah Fitzgerald, female   DOB: 1946-03-01, 71 y.o.   MRN: [id number]   SICU Evening Rounds:   Hemodynamically stable  CI = 1.68  Has started to wake up on vent. Moves all extremities to command   Urine output good  CT output low  CBC    Component Value Date/Time   WBC 18.2 (H) 07/28/2016 1449   RBC 2.89 (L) 07/28/2016 1449   HGB 8.8 (L) 07/28/2016 1455   HCT 26.0 (L) 07/28/2016 1455   PLT 120 (L) 07/28/2016 1449   MCV 93.8 07/28/2016 1449   MCH 32.2 07/28/2016 1449   MCHC 34.3 07/28/2016 1449   RDW 13.4 07/28/2016 1449     BMET    Component Value Date/Time   NA 146 (H) 07/28/2016 1455   K 3.5 07/28/2016 1455   CL 98 (L) 07/28/2016 1038   CO2 23 07/26/2016 0827   GLUCOSE 90 07/28/2016 1455   BUN 7 07/28/2016 1038   CREATININE 0.30 (L) 07/28/2016 1038   CALCIUM 9.4 07/26/2016 0827   GFRNONAA >60 07/26/2016 0827   GFRAA >60 07/26/2016 0827     A/P:  Stable postop course. Continue current plans

## 2016-07-28 NOTE — Interval H&P Note (Signed)
History and Physical Interval Note:  07/28/2016 6:54 AM  Hannah Fitzgerald  has presented today for surgery, with the diagnosis of MR  The various methods of treatment have been discussed with the patient and family. After consideration of risks, benefits and other options for treatment, the patient has consented to  Procedure(s): MINIMALLY INVASIVE MITRAL VALVE REPAIR (MVR) (Right) TRANSESOPHAGEAL ECHOCARDIOGRAM (TEE) (N/A) as a surgical intervention .  The patient's history has been reviewed, patient examined, no change in status, stable for surgery.  I have reviewed the patient's chart and labs.  Questions were answered to the patient's satisfaction.     Purcell Nailslarence H Milanna Kozlov

## 2016-07-28 NOTE — Anesthesia Procedure Notes (Signed)
Procedure Name: Intubation Date/Time: 07/28/2016 8:02 AM Performed by: Annabelle HarmanSMITH, Johnathan Heskett A Pre-anesthesia Checklist: Patient identified, Emergency Drugs available, Patient being monitored and Suction available Patient Re-evaluated:Patient Re-evaluated prior to inductionOxygen Delivery Method: Circle system utilized Preoxygenation: Pre-oxygenation with 100% oxygen Intubation Type: IV induction, Rapid sequence and Cricoid Pressure applied Ventilation: Mask ventilation without difficulty Laryngoscope Size: Miller and 2 Grade View: Grade I Endobronchial tube: Left, Double lumen EBT, EBT position confirmed by fiberoptic bronchoscope and EBT position confirmed by auscultation and 37 Fr Number of attempts: 1 Airway Equipment and Method: Stylet and Fiberoptic brochoscope Placement Confirmation: ETT inserted through vocal cords under direct vision,  positive ETCO2 and breath sounds checked- equal and bilateral Secured at: 31 (at teeth) cm Tube secured with: Tape Dental Injury: Teeth and Oropharynx as per pre-operative assessment

## 2016-07-28 NOTE — Procedures (Signed)
Extubation Procedure Note  Patient Details:   Name: Hannah SpeedJanet M Irigoyen DOB: 1946/05/28 MRN: [id number]   Airway Documentation:  Airway 7 mm (Active)  Secured at (cm) 22 cm 07/28/2016 10:49 PM  Measured From Lips 07/28/2016 10:49 PM  Secured Location Right 07/28/2016 10:49 PM  Secured By Caron PresumePink Tape 07/28/2016 10:49 PM  Cuff Pressure (cm H2O) 24 cm H2O 07/28/2016  8:03 PM  Site Condition Dry 07/28/2016 10:49 PM    Evaluation  O2 sats: stable throughout Complications: No apparent complications Patient did tolerate procedure well. Bilateral Breath Sounds: Clear  No upper airway strider heard. Patient placed on 5lpm nasal cannula. NIF=-28 Vital Capacity=1.00  Yes  [name], [name] 07/28/2016, 11:15 PM

## 2016-07-28 NOTE — Anesthesia Procedure Notes (Deleted)
Procedures

## 2016-07-29 ENCOUNTER — Inpatient Hospital Stay (HOSPITAL_COMMUNITY): Payer: Medicare Other

## 2016-07-29 ENCOUNTER — Encounter (HOSPITAL_COMMUNITY): Payer: Self-pay | Admitting: Thoracic Surgery (Cardiothoracic Vascular Surgery)

## 2016-07-29 LAB — POCT I-STAT 3, ART BLOOD GAS (G3+)
Acid-Base Excess: 5 mmol/L — ABNORMAL HIGH (ref 0.0–2.0)
Acid-base deficit: 1 mmol/L (ref 0.0–2.0)
Acid-base deficit: 2 mmol/L (ref 0.0–2.0)
Acid-base deficit: 5 mmol/L — ABNORMAL HIGH (ref 0.0–2.0)
Bicarbonate: 21 mmol/L (ref 20.0–28.0)
Bicarbonate: 21.1 mmol/L (ref 20.0–28.0)
Bicarbonate: 23.4 mmol/L (ref 20.0–28.0)
Bicarbonate: 29.4 mmol/L — ABNORMAL HIGH (ref 20.0–28.0)
O2 SAT: 99 %
O2 SAT: 99 %
O2 Saturation: 100 %
O2 Saturation: 99 %
PCO2 ART: 29.3 mmHg — AB (ref 32.0–48.0)
PCO2 ART: 36.6 mmHg (ref 32.0–48.0)
PCO2 ART: 41.3 mmHg (ref 32.0–48.0)
PH ART: 7.313 — AB (ref 7.350–7.450)
PH ART: 7.414 (ref 7.350–7.450)
PH ART: 7.464 — AB (ref 7.350–7.450)
Patient temperature: 36.6
Patient temperature: 36.6
TCO2: 22 mmol/L (ref 0–100)
TCO2: 22 mmol/L (ref 0–100)
TCO2: 25 mmol/L (ref 0–100)
TCO2: 31 mmol/L (ref 0–100)
pCO2 arterial: 43.8 mmHg (ref 32.0–48.0)
pH, Arterial: 7.434 (ref 7.350–7.450)
pO2, Arterial: 138 mmHg — ABNORMAL HIGH (ref 83.0–108.0)
pO2, Arterial: 145 mmHg — ABNORMAL HIGH (ref 83.0–108.0)
pO2, Arterial: 148 mmHg — ABNORMAL HIGH (ref 83.0–108.0)
pO2, Arterial: 420 mmHg — ABNORMAL HIGH (ref 83.0–108.0)

## 2016-07-29 LAB — BLOOD GAS, ARTERIAL
Acid-base deficit: 1.5 mmol/L (ref 0.0–2.0)
Bicarbonate: 23 mmol/L (ref 20.0–28.0)
O2 Content: 4 L/min
O2 Saturation: 98.1 %
PATIENT TEMPERATURE: 98.6
PCO2 ART: 41 mmHg (ref 32.0–48.0)
pH, Arterial: 7.368 (ref 7.350–7.450)
pO2, Arterial: 118 mmHg — ABNORMAL HIGH (ref 83.0–108.0)

## 2016-07-29 LAB — CBC
HCT: 22.8 % — ABNORMAL LOW (ref 36.0–46.0)
HEMATOCRIT: 24.2 % — AB (ref 36.0–46.0)
HEMOGLOBIN: 8.2 g/dL — AB (ref 12.0–15.0)
HEMOGLOBIN: 7.7 g/dL — AB (ref 12.0–15.0)
MCH: 31.9 pg (ref 26.0–34.0)
MCH: 32 pg (ref 26.0–34.0)
MCHC: 33.9 g/dL (ref 30.0–36.0)
MCHC: 33.8 g/dL (ref 30.0–36.0)
MCV: 94.2 fL (ref 78.0–100.0)
MCV: 94.6 fL (ref 78.0–100.0)
Platelets: 96 10*3/uL — ABNORMAL LOW (ref 150–400)
Platelets: 96 10*3/uL — ABNORMAL LOW (ref 150–400)
RBC: 2.57 MIL/uL — ABNORMAL LOW (ref 3.87–5.11)
RBC: 2.41 MIL/uL — AB (ref 3.87–5.11)
RDW: 13.5 % (ref 11.5–15.5)
RDW: 13.9 % (ref 11.5–15.5)
WBC: 10.6 10*3/uL — ABNORMAL HIGH (ref 4.0–10.5)
WBC: 12.3 10*3/uL — ABNORMAL HIGH (ref 4.0–10.5)

## 2016-07-29 LAB — GLUCOSE, CAPILLARY
GLUCOSE-CAPILLARY: 101 mg/dL — AB (ref 65–99)
GLUCOSE-CAPILLARY: 126 mg/dL — AB (ref 65–99)
GLUCOSE-CAPILLARY: 121 mg/dL — AB (ref 65–99)
Glucose-Capillary: 113 mg/dL — ABNORMAL HIGH (ref 65–99)
Glucose-Capillary: 114 mg/dL — ABNORMAL HIGH (ref 65–99)
Glucose-Capillary: 99 mg/dL (ref 65–99)

## 2016-07-29 LAB — POCT I-STAT, CHEM 8
BUN: 7 mg/dL (ref 6–20)
BUN: 6 mg/dL (ref 6–20)
BUN: 8 mg/dL (ref 6–20)
BUN: 9 mg/dL (ref 6–20)
CALCIUM ION: 0.86 mmol/L — AB (ref 1.15–1.40)
CALCIUM ION: 0.86 mmol/L — AB (ref 1.15–1.40)
CALCIUM ION: 1.02 mmol/L — AB (ref 1.15–1.40)
CHLORIDE: 106 mmol/L (ref 101–111)
CREATININE: 0.6 mg/dL (ref 0.44–1.00)
Calcium, Ion: 0.89 mmol/L — CL (ref 1.15–1.40)
Chloride: 104 mmol/L (ref 101–111)
Chloride: 108 mmol/L (ref 101–111)
Chloride: 105 mmol/L (ref 101–111)
Creatinine, Ser: 0.3 mg/dL — ABNORMAL LOW (ref 0.44–1.00)
Creatinine, Ser: 0.3 mg/dL — ABNORMAL LOW (ref 0.44–1.00)
Creatinine, Ser: 0.5 mg/dL (ref 0.44–1.00)
GLUCOSE: 156 mg/dL — AB (ref 65–99)
Glucose, Bld: 106 mg/dL — ABNORMAL HIGH (ref 65–99)
Glucose, Bld: 116 mg/dL — ABNORMAL HIGH (ref 65–99)
Glucose, Bld: 119 mg/dL — ABNORMAL HIGH (ref 65–99)
HCT: 24 % — ABNORMAL LOW (ref 36.0–46.0)
HCT: 20 % — ABNORMAL LOW (ref 36.0–46.0)
HEMATOCRIT: 23 % — AB (ref 36.0–46.0)
HEMATOCRIT: 23 % — AB (ref 36.0–46.0)
HEMOGLOBIN: 7.8 g/dL — AB (ref 12.0–15.0)
HEMOGLOBIN: 7.8 g/dL — AB (ref 12.0–15.0)
HEMOGLOBIN: 8.2 g/dL — AB (ref 12.0–15.0)
HEMOGLOBIN: 6.8 g/dL — AB (ref 12.0–15.0)
POTASSIUM: 4.2 mmol/L (ref 3.5–5.1)
POTASSIUM: 5.2 mmol/L — AB (ref 3.5–5.1)
Potassium: 4.7 mmol/L (ref 3.5–5.1)
Potassium: 3.9 mmol/L (ref 3.5–5.1)
SODIUM: 142 mmol/L (ref 135–145)
SODIUM: 145 mmol/L (ref 135–145)
Sodium: 143 mmol/L (ref 135–145)
Sodium: 142 mmol/L (ref 135–145)
TCO2: 29 mmol/L (ref 0–100)
TCO2: 24 mmol/L (ref 0–100)
TCO2: 28 mmol/L (ref 0–100)
TCO2: 24 mmol/L (ref 0–100)

## 2016-07-29 LAB — MAGNESIUM
Magnesium: 2.1 mg/dL (ref 1.7–2.4)
Magnesium: 2.2 mg/dL (ref 1.7–2.4)

## 2016-07-29 LAB — BASIC METABOLIC PANEL
Anion gap: 8 (ref 5–15)
BUN: 6 mg/dL (ref 6–20)
CHLORIDE: 109 mmol/L (ref 101–111)
CO2: 23 mmol/L (ref 22–32)
CREATININE: 0.62 mg/dL (ref 0.44–1.00)
Calcium: 6.6 mg/dL — ABNORMAL LOW (ref 8.9–10.3)
GFR calc Af Amer: >60 mL/min (ref >60)
GFR calc non Af Amer: >60 mL/min (ref >60)
Glucose, Bld: 119 mg/dL — ABNORMAL HIGH (ref 65–99)
Potassium: 3.6 mmol/L (ref 3.5–5.1)
Sodium: 140 mmol/L (ref 135–145)

## 2016-07-29 LAB — CREATININE, SERUM
Creatinine, Ser: 0.63 mg/dL (ref 0.44–1.00)
GFR calc non Af Amer: >60 mL/min (ref >60)

## 2016-07-29 MED ORDER — ENOXAPARIN SODIUM 30 MG/0.3ML ~~LOC~~ SOLN
30.0000 mg | SUBCUTANEOUS | Status: DC
Start: 2016-07-30 — End: 2016-07-31
  Administered 2016-07-30 – 2016-07-31 (×2): 30 mg via SUBCUTANEOUS
  Filled 2016-07-29 (×2): qty 0.3

## 2016-07-29 MED ORDER — SODIUM CHLORIDE 0.9 % IV SOLN
30.0000 meq | Freq: Once | INTRAVENOUS | Status: AC
  Administered 2016-07-29: 30 meq via INTRAVENOUS
  Filled 2016-07-29 (×2): qty 15

## 2016-07-29 MED ORDER — WARFARIN - PHYSICIAN DOSING INPATIENT
Freq: Every day | Status: DC
  Administered 2016-07-30 – 2016-07-31 (×2): 1

## 2016-07-29 MED ORDER — WARFARIN SODIUM 2.5 MG PO TABS
2.5000 mg | ORAL_TABLET | Freq: Every day | ORAL | Status: DC
  Administered 2016-07-29 – 2016-07-30 (×2): 2.5 mg via ORAL
  Filled 2016-07-29 (×2): qty 1

## 2016-07-29 MED ORDER — POLYSACCHARIDE IRON COMPLEX 150 MG PO CAPS
150.0000 mg | ORAL_CAPSULE | Freq: Every day | ORAL | Status: DC
  Administered 2016-07-31 – 2016-08-04 (×5): 150 mg via ORAL
  Filled 2016-07-29 (×5): qty 1

## 2016-07-29 MED ORDER — FA-PYRIDOXINE-CYANOCOBALAMIN 2.5-25-2 MG PO TABS
1.0000 | ORAL_TABLET | Freq: Every day | ORAL | Status: DC
  Administered 2016-07-30: 1 via ORAL
  Filled 2016-07-29 (×2): qty 1

## 2016-07-29 MED ORDER — ALPRAZOLAM 0.25 MG PO TABS: 0.2500 mg | ORAL_TABLET | Freq: Three times a day (TID) | ORAL | Status: DC | PRN

## 2016-07-29 MED ORDER — ASPIRIN EC 81 MG PO TBEC
81.0000 mg | DELAYED_RELEASE_TABLET | Freq: Every day | ORAL | Status: DC
  Administered 2016-07-30 – 2016-08-04 (×6): 81 mg via ORAL
  Filled 2016-07-29 (×6): qty 1

## 2016-07-29 MED ORDER — METOCLOPRAMIDE HCL 5 MG/ML IJ SOLN
10.0000 mg | Freq: Four times a day (QID) | INTRAMUSCULAR | Status: AC
  Administered 2016-07-29 – 2016-07-30 (×4): 10 mg via INTRAVENOUS
  Filled 2016-07-29 (×4): qty 2

## 2016-07-29 MED ORDER — LEVOTHYROXINE SODIUM 75 MCG PO TABS
75.0000 ug | ORAL_TABLET | Freq: Every day | ORAL | Status: DC
  Administered 2016-07-29 – 2016-08-02 (×5): 75 ug via ORAL
  Filled 2016-07-29 (×5): qty 1

## 2016-07-29 MED ORDER — PRAVASTATIN SODIUM 20 MG PO TABS
20.0000 mg | ORAL_TABLET | Freq: Every evening | ORAL | Status: DC
  Administered 2016-07-31 – 2016-08-03 (×4): 20 mg via ORAL
  Filled 2016-07-29 (×4): qty 1

## 2016-07-29 MED FILL — Sodium Chloride IV Soln 0.9%: INTRAVENOUS | Qty: 2000 | Status: AC

## 2016-07-29 MED FILL — Sodium Bicarbonate IV Soln 8.4%: INTRAVENOUS | Qty: 50 | Status: AC

## 2016-07-29 MED FILL — Albumin, Human Inj 5%: INTRAVENOUS | Qty: 250 | Status: AC

## 2016-07-29 MED FILL — Heparin Sodium (Porcine) Inj 1000 Unit/ML: INTRAMUSCULAR | Qty: 10 | Status: AC

## 2016-07-29 MED FILL — Lidocaine HCl IV Inj 20 MG/ML: INTRAVENOUS | Qty: 5 | Status: AC

## 2016-07-29 MED FILL — Electrolyte-R (PH 7.4) Solution: INTRAVENOUS | Qty: 5000 | Status: AC

## 2016-07-29 MED FILL — Mannitol IV Soln 20%: INTRAVENOUS | Qty: 500 | Status: AC

## 2016-07-29 NOTE — Plan of Care (Signed)
Problem: Respiratory: Goal: Ability to tolerate decreased levels of ventilator support will improve Outcome: Completed/Met Date Met: 07/29/16 Pt was extubated 3/2 at 2315 and is tolerating 2L nasal cannula well.

## 2016-07-29 NOTE — Progress Notes (Signed)
SICU p.m. Rounds  Patient examined and record reviewed.Hemodynamics stable,labs satisfactory.Patient had stable day.Continue current care. Kathlee Nationseter Van Trigt III 07/29/2016

## 2016-07-29 NOTE — Plan of Care (Signed)
Problem: Activity: Goal: Risk for activity intolerance will decrease Outcome: Progressing Pt was able to ambulate 50 ft today  Problem: Pain Management: Goal: Pain level will decrease Outcome: Progressing Pts pain is adequately controlled with prescribed pain medication  Problem: Urinary Elimination: Goal: Ability to achieve and maintain adequate renal perfusion and functioning will improve Outcome: Progressing Pt is able to maintain at least 30cc/hour of urine

## 2016-07-29 NOTE — Progress Notes (Signed)
[address]       [place] [address]             [phone]        CARDIOTHORACIC SURGERY PROGRESS NOTE   R1 Day Post-Op Procedure(s) (LRB): MINIMALLY INVASIVE MITRAL VALVE REPAIR (MVR) using a 30 Sorin Memo 3D Mitral Valve Ring (Right) TRANSESOPHAGEAL ECHOCARDIOGRAM (TEE) (N/A)  Subjective: Looks good.  Feels weak and tired.  Mild soreness in chest.  No SOB.  No nausea  Objective: Vital signs: BP Readings from Last 1 Encounters:  07/29/16 (!) 88/57   Pulse Readings from Last 1 Encounters:  07/29/16 89   Resp Readings from Last 1 Encounters:  07/29/16 (!) 21   Temp Readings from Last 1 Encounters:  07/29/16 98.4 F (36.9 C)    Hemodynamics: PAP: (16-36)/(7-22) 16/8 CO:  [1.6 L/min-3.5 L/min] 3 L/min CI:  [1.2 L/min/m2-2.5 L/min/m2] 2.1 L/min/m2  Physical Exam:  Rhythm:   Junctional bradycardia  - AAI pacing  Breath sounds: clear  Heart sounds:  RRR w/out murmur  Incisions:  Dressings dry intact  Abdomen:  Soft, non-distended, non-tender  Extremities:  Warm, well-perfused  Chest tubes:  low volume thin serosanguinous output, no air leak    Intake/Output from previous day: 03/01 0701 - 03/02 0700 In: 7518.5 [I.V.:4158.5; Blood:270; IV Piggyback:3090] Out: 91478 [Urine:10200; Blood:750; Chest Tube:990] Intake/Output this shift: Total I/O In: -  Out: 60 [Urine:60]  Lab Results:  CBC: Recent Labs  07/28/16 2050 07/28/16 2057 07/29/16 0300  WBC 11.0*  --  10.6*  HGB 8.4* 7.5* 8.2*  HCT 24.2* 22.0* 24.2*  PLT 94*  --  96*    BMET:  Recent Labs  07/26/16 0827  07/28/16 2057 07/29/16 0300  NA 139  < > 145 140  K 4.1  < > 3.9 3.6  CL 105  < > 110 109  CO2 23  --   --  23  GLUCOSE 109*  < > 164* 119*  BUN 16  < > 6 6  CREATININE 0.69  < > 0.50 0.62  CALCIUM 9.4  --   --  6.6*  < > = values in this interval not displayed.   PT/INR:   Recent Labs  07/28/16 1449  LABPROT 19.4*  INR 1.62    CBG (last 3)    Recent Labs  07/28/16 2347 07/29/16 0359 07/29/16 0737  GLUCAP 127* 101* 114*    ABG    Component Value Date/Time   PHART 7.368 07/29/2016 0313   PCO2ART 41.0 07/29/2016 0313   PO2ART 118 (H) 07/29/2016 0313   HCO3 23.0 07/29/2016 0313   TCO2 22 07/29/2016 0019   ACIDBASEDEF 1.5 07/29/2016 0313   O2SAT 98.1 07/29/2016 0313    CXR: PORTABLE CHEST 1 VIEW  COMPARISON:  07/28/2016.  FINDINGS: Interim extubation and removal of NG tube. Swan-Ganz catheter and mediastinal drainage catheter in stable position. Right chest tube in stable position. No pneumothorax. Prior cardiac valve replacement. Heart size stable. Basilar atelectasis. Small bilateral pleural effusions.  IMPRESSION: 1. Interim extubation and removal of NG tube. Remaining lines and tubes including right chest tube in stable position. No pneumothorax.  2. Low lung volumes with mild basilar atelectasis. Small bilateral pleural effusions.   Electronically Signed   By: Maisie Fus  Register   On: 07/29/2016 07:42   EKG: Slow junctional rhythm w/out acute ischemic changes     Assessment/Plan: S/P Procedure(s) (LRB): MINIMALLY INVASIVE MITRAL VALVE REPAIR (MVR) using a 30  Sorin Memo 3D Mitral Valve Ring (Right) TRANSESOPHAGEAL ECHOCARDIOGRAM (TEE) (N/A)  Overall doing well POD1 Maintaining AAI paced rhythm w/ stable hemodynamics on low dose Neo and dopamine for BP support, PA pressures low Breathing comfortably w/ O2 sats 100% on 2 L/min Expected post op acute blood loss anemia, Hgb stable 8.2 this morning Expected post op atelectasis, mild Expected post op volume excess, weight reportedly 6 lbs > preop, spontaneously diuresing History of Lupus anticoagulant positive on coumadin preoperatively   Mobilize  Advance diet  Continue AAI pacing and hold beta blockers for now  Wean drips as tolerated  D/C lines  Leave tubes in at least 2-3 days until output decreases  Restart Coumadin at  reduced dose  Low dose lovenox to begin tomorrow   Purcell Nailslarence H Yudith Norlander, MD 07/29/2016 8:20 AM

## 2016-07-30 ENCOUNTER — Inpatient Hospital Stay (HOSPITAL_COMMUNITY): Payer: Medicare Other

## 2016-07-30 LAB — BASIC METABOLIC PANEL
Anion gap: 7 (ref 5–15)
BUN: 9 mg/dL (ref 6–20)
CO2: 25 mmol/L (ref 22–32)
CREATININE: 0.63 mg/dL (ref 0.44–1.00)
Calcium: 7.8 mg/dL — ABNORMAL LOW (ref 8.9–10.3)
Chloride: 106 mmol/L (ref 101–111)
GFR calc Af Amer: >60 mL/min (ref >60)
Glucose, Bld: 108 mg/dL — ABNORMAL HIGH (ref 65–99)
Potassium: 3.7 mmol/L (ref 3.5–5.1)
SODIUM: 138 mmol/L (ref 135–145)

## 2016-07-30 LAB — PROTIME-INR
INR: 1.6
Prothrombin Time: 19.2 seconds — ABNORMAL HIGH (ref 11.4–15.2)

## 2016-07-30 LAB — PREPARE RBC (CROSSMATCH)

## 2016-07-30 LAB — GLUCOSE, CAPILLARY
GLUCOSE-CAPILLARY: 105 mg/dL — AB (ref 65–99)
GLUCOSE-CAPILLARY: 151 mg/dL — AB (ref 65–99)
Glucose-Capillary: 137 mg/dL — ABNORMAL HIGH (ref 65–99)
Glucose-Capillary: 123 mg/dL — ABNORMAL HIGH (ref 65–99)
Glucose-Capillary: 117 mg/dL — ABNORMAL HIGH (ref 65–99)

## 2016-07-30 LAB — CBC
HCT: 20.8 % — ABNORMAL LOW (ref 36.0–46.0)
HCT: 36.5 % (ref 36.0–46.0)
Hemoglobin: 7.2 g/dL — ABNORMAL LOW (ref 12.0–15.0)
Hemoglobin: 12.1 g/dL (ref 12.0–15.0)
MCH: 33 pg (ref 26.0–34.0)
MCH: 31 pg (ref 26.0–34.0)
MCHC: 34.6 g/dL (ref 30.0–36.0)
MCHC: 33.2 g/dL (ref 30.0–36.0)
MCV: 95.4 fL (ref 78.0–100.0)
MCV: 93.6 fL (ref 78.0–100.0)
PLATELETS: 86 10*3/uL — AB (ref 150–400)
Platelets: 114 10*3/uL — ABNORMAL LOW (ref 150–400)
RBC: 2.18 MIL/uL — ABNORMAL LOW (ref 3.87–5.11)
RBC: 3.9 MIL/uL (ref 3.87–5.11)
RDW: 14.3 % (ref 11.5–15.5)
RDW: 14.9 % (ref 11.5–15.5)
WBC: 10.9 10*3/uL — AB (ref 4.0–10.5)
WBC: 12.7 10*3/uL — ABNORMAL HIGH (ref 4.0–10.5)

## 2016-07-30 MED ORDER — SODIUM CHLORIDE 0.9 % IV SOLN
30.0000 meq | Freq: Once | INTRAVENOUS | Status: AC
  Administered 2016-07-30: 30 meq via INTRAVENOUS
  Filled 2016-07-30: qty 15

## 2016-07-30 MED ORDER — FUROSEMIDE 10 MG/ML IJ SOLN
20.0000 mg | Freq: Once | INTRAMUSCULAR | Status: AC
  Administered 2016-07-30: 20 mg via INTRAVENOUS
  Filled 2016-07-30: qty 2

## 2016-07-30 MED ORDER — SODIUM CHLORIDE 0.9 % IV SOLN: Freq: Once | INTRAVENOUS | Status: DC

## 2016-07-30 MED ORDER — ALPRAZOLAM 0.25 MG PO TABS
0.2500 mg | ORAL_TABLET | Freq: Two times a day (BID) | ORAL | Status: DC | PRN
  Administered 2016-08-01 – 2016-08-03 (×3): 0.25 mg via ORAL
  Filled 2016-07-30 (×3): qty 1

## 2016-07-30 NOTE — Progress Notes (Signed)
CT Surgery PM Rounds  HR 60 junctional -still a-pacing Pm Hb pending

## 2016-07-30 NOTE — Progress Notes (Addendum)
2 Days Post-Op Procedure(s) (LRB): MINIMALLY INVASIVE MITRAL VALVE REPAIR (MVR) using a 30 Sorin Memo 3D Mitral Valve Ring (Right) TRANSESOPHAGEAL ECHOCARDIOGRAM (TEE) (N/A) Subjective: Transfusion for Hb 7.0 A-pacing Chest tubes 700 cc Objective: Vital signs in last 24 hours: Temp:  [97.4 F (36.3 C)-98.5 F (36.9 C)] 97.9 F (36.6 C) (03/03 0900) Pulse Rate:  [79-88] 88 (03/03 0900) Cardiac Rhythm: Atrial paced (03/03 0000) Resp:  [0-27] 14 (03/03 0900) BP: (82-107)/(45-79) 96/58 (03/03 0900) SpO2:  [92 %-100 %] 97 % (03/03 0900) Arterial Line BP: (70-113)/(37-60) 98/51 (03/03 0900) Weight:  [105 lb 9.6 oz (47.9 kg)] 105 lb 9.6 oz (47.9 kg) (03/03 0500)  Hemodynamic parameters for last 24 hours:    Intake/Output from previous day: 03/02 0701 - 03/03 0700 In: 1647.2 [P.O.:240; I.V.:547.2; Blood:305; IV Piggyback:315] Out: 1990 [Urine:1290; Chest Tube:700] Intake/Output this shift: Total I/O In: 330 [Blood:330] Out: 140 [Urine:100; Chest Tube:40]  Up in chair  lungs clear  Lab Results:  Recent Labs  07/29/16 1617 07/29/16 1632 07/30/16 0258  WBC 12.3*  --  10.9*  HGB 7.7* 6.8* 7.2*  HCT 22.8* 20.0* 20.8*  PLT 96*  --  86*   BMET:  Recent Labs  07/29/16 0300  07/29/16 1632 07/30/16 0258  NA 140  --  142 138  K 3.6  --  3.9 3.7  CL 109  --  105 106  CO2 23  --   --  25  GLUCOSE 119*  --  156* 108*  BUN 6  --  9 9  CREATININE 0.62  < > 0.60 0.63  CALCIUM 6.6*  --   --  7.8*  < > = values in this interval not displayed.  PT/INR:  Recent Labs  07/30/16 0258  LABPROT 19.2*  INR 1.60   ABG    Component Value Date/Time   PHART 7.368 07/29/2016 0313   HCO3 23.0 07/29/2016 0313   TCO2 24 07/29/2016 1632   ACIDBASEDEF 1.5 07/29/2016 0313   O2SAT 98.1 07/29/2016 0313   CBG (last 3)  Recent Labs  07/29/16 2337 07/30/16 0346 07/30/16 0747  GLUCAP 99 105* 137*    Assessment/Plan: S/P Procedure(s) (LRB): MINIMALLY INVASIVE MITRAL VALVE REPAIR  (MVR) using a 30 Sorin Memo 3D Mitral Valve Ring (Right) TRANSESOPHAGEAL ECHOCARDIOGRAM (TEE) (N/A) wean dopamine post  Lasix ordered   LOS: 2 days    Kathlee Nationseter Van Trigt III 07/30/2016

## 2016-07-31 ENCOUNTER — Inpatient Hospital Stay (HOSPITAL_COMMUNITY): Payer: Medicare Other

## 2016-07-31 LAB — BASIC METABOLIC PANEL
Anion gap: 8 (ref 5–15)
BUN: 14 mg/dL (ref 6–20)
CO2: 26 mmol/L (ref 22–32)
Calcium: 8.3 mg/dL — ABNORMAL LOW (ref 8.9–10.3)
Chloride: 105 mmol/L (ref 101–111)
Creatinine, Ser: 0.77 mg/dL (ref 0.44–1.00)
GFR calc Af Amer: >60 mL/min (ref >60)
GFR calc non Af Amer: >60 mL/min (ref >60)
Glucose, Bld: 93 mg/dL (ref 65–99)
Potassium: 3.7 mmol/L (ref 3.5–5.1)
Sodium: 139 mmol/L (ref 135–145)

## 2016-07-31 LAB — GLUCOSE, CAPILLARY
GLUCOSE-CAPILLARY: 127 mg/dL — AB (ref 65–99)
GLUCOSE-CAPILLARY: 80 mg/dL (ref 65–99)
GLUCOSE-CAPILLARY: 152 mg/dL — AB (ref 65–99)
GLUCOSE-CAPILLARY: 90 mg/dL (ref 65–99)
Glucose-Capillary: 90 mg/dL (ref 65–99)

## 2016-07-31 LAB — CBC
HCT: 32.5 % — ABNORMAL LOW (ref 36.0–46.0)
Hemoglobin: 11 g/dL — ABNORMAL LOW (ref 12.0–15.0)
MCH: 31.4 pg (ref 26.0–34.0)
MCHC: 33.8 g/dL (ref 30.0–36.0)
MCV: 92.9 fL (ref 78.0–100.0)
Platelets: 97 10*3/uL — ABNORMAL LOW (ref 150–400)
RBC: 3.5 MIL/uL — ABNORMAL LOW (ref 3.87–5.11)
RDW: 14.9 % (ref 11.5–15.5)
WBC: 12.7 10*3/uL — ABNORMAL HIGH (ref 4.0–10.5)

## 2016-07-31 LAB — PROTIME-INR
INR: 1.98
Prothrombin Time: 22.8 seconds — ABNORMAL HIGH (ref 11.4–15.2)

## 2016-07-31 MED ORDER — MAGNESIUM HYDROXIDE 400 MG/5ML PO SUSP: 30.0000 mL | Freq: Every day | ORAL | Status: DC | PRN

## 2016-07-31 MED ORDER — FUROSEMIDE 10 MG/ML IJ SOLN
20.0000 mg | Freq: Two times a day (BID) | INTRAMUSCULAR | Status: DC
  Administered 2016-07-31 – 2016-08-01 (×3): 20 mg via INTRAVENOUS
  Filled 2016-07-31 (×3): qty 2

## 2016-07-31 MED ORDER — SODIUM CHLORIDE 0.9 % IV SOLN
30.0000 meq | Freq: Once | INTRAVENOUS | Status: AC
  Administered 2016-07-31: 30 meq via INTRAVENOUS
  Filled 2016-07-31: qty 15

## 2016-07-31 MED ORDER — SODIUM CHLORIDE 0.9% FLUSH
3.0000 mL | INTRAVENOUS | Status: DC | PRN
  Administered 2016-08-01 (×2): 3 mL via INTRAVENOUS
  Filled 2016-07-31 (×2): qty 3

## 2016-07-31 MED ORDER — SODIUM CHLORIDE 0.9 % IV SOLN: 250.0000 mL | INTRAVENOUS | Status: DC | PRN

## 2016-07-31 MED ORDER — MOVING RIGHT ALONG BOOK
Freq: Once | Status: AC
  Administered 2016-07-31: 1
  Filled 2016-07-31: qty 1

## 2016-07-31 MED ORDER — WARFARIN SODIUM 2 MG PO TABS
2.0000 mg | ORAL_TABLET | Freq: Every day | ORAL | Status: DC
  Administered 2016-07-31: 2 mg via ORAL
  Filled 2016-07-31: qty 1

## 2016-07-31 MED ORDER — POTASSIUM CHLORIDE CRYS ER 20 MEQ PO TBCR
20.0000 meq | EXTENDED_RELEASE_TABLET | Freq: Every day | ORAL | Status: DC
  Administered 2016-07-31 – 2016-08-03 (×4): 20 meq via ORAL
  Filled 2016-07-31 (×4): qty 1

## 2016-07-31 MED ORDER — SODIUM CHLORIDE 0.9% FLUSH
3.0000 mL | Freq: Two times a day (BID) | INTRAVENOUS | Status: DC
  Administered 2016-07-31 – 2016-08-02 (×4): 3 mL via INTRAVENOUS

## 2016-07-31 MED ORDER — MENTHOL 3 MG MT LOZG
1.0000 | LOZENGE | OROMUCOSAL | Status: DC | PRN
  Administered 2016-07-31: 3 mg via ORAL
  Filled 2016-07-31: qty 9

## 2016-07-31 MED ORDER — INSULIN ASPART 100 UNIT/ML ~~LOC~~ SOLN
0.0000 [IU] | Freq: Three times a day (TID) | SUBCUTANEOUS | Status: DC
  Administered 2016-07-31: 2 [IU] via SUBCUTANEOUS

## 2016-07-31 NOTE — Progress Notes (Signed)
CT surgery  Patient had very good day today with ambulation and less incisional pain Atrially paced at 72 with underlying heart rate 60 Waiting for step down bed INR therapeutic, Lovenox stopped Adequate urine output with Lasix

## 2016-07-31 NOTE — Plan of Care (Signed)
Problem: Activity: Goal: Risk for activity intolerance will decrease Outcome: Completed/Met Date Met: 07/31/16 Pt ambulated x 3 today.  Problem: Respiratory: Goal: Levels of oxygenation will improve Outcome: Completed/Met Date Met: 07/31/16 Pt on room air sats 98%

## 2016-07-31 NOTE — Progress Notes (Signed)
3 Days Post-Op Procedure(s) (LRB): MINIMALLY INVASIVE MITRAL VALVE REPAIR (MVR) using a 30 Sorin Memo 3D Mitral Valve Ring (Right) TRANSESOPHAGEAL ECHOCARDIOGRAM (TEE) (N/A) Subjective: Much stronger after transfusion Native HR now 58 NSR - will AAI pace at 72 chestntubes with 450 -p leave today Walking in hall INR at goal tx to stepdown Objective: Vital signs in last 24 hours: Temp:  [97.1 F (36.2 C)-99 F (37.2 C)] 97.3 F (36.3 C) (03/04 0836) Pulse Rate:  [84-88] 84 (03/04 0700) Cardiac Rhythm: Atrial paced (03/04 0400) Resp:  [7-24] 16 (03/04 0700) BP: (96-133)/(49-102) 114/74 (03/04 0700) SpO2:  [94 %-100 %] 96 % (03/04 0700) Arterial Line BP: (94-122)/(56-63) 115/63 (03/03 1200) Weight:  [108 lb 0.4 oz (49 kg)] 108 lb 0.4 oz (49 kg) (03/04 0500)  Hemodynamic parameters for last 24 hours:  stable  Intake/Output from previous day: 03/03 0701 - 03/04 0700 In: 2325.1 [P.O.:840; I.V.:230.1; Blood:990; IV Piggyback:265] Out: 1960 [Urine:1500; Chest Tube:460] Intake/Output this shift: Total I/O In: -  Out: 200 [Urine:200]       Exam    General- alert and comfortable   Lungs- clear without rales, wheezes   Cor- regular rate and rhythm, no murmur , gallop   Abdomen- soft, non-tender   Extremities - warm, non-tender, minimal edema   Neuro- oriented, appropriate, no focal weakness   Lab Results:  Recent Labs  07/30/16 1751 07/31/16 0345  WBC 12.7* 12.7*  HGB 12.1 11.0*  HCT 36.5 32.5*  PLT 114* 97*   BMET:  Recent Labs  07/30/16 0258 07/31/16 0345  NA 138 139  K 3.7 3.7  CL 106 105  CO2 25 26  GLUCOSE 108* 93  BUN 9 14  CREATININE 0.63 0.77  CALCIUM 7.8* 8.3*    PT/INR:  Recent Labs  07/31/16 0345  LABPROT 22.8*  INR 1.98   ABG    Component Value Date/Time   PHART 7.368 07/29/2016 0313   HCO3 23.0 07/29/2016 0313   TCO2 24 07/29/2016 1632   ACIDBASEDEF 1.5 07/29/2016 0313   O2SAT 98.1 07/29/2016 0313   CBG (last 3)   Recent Labs  07/30/16 1953 07/31/16 0416 07/31/16 0834  GLUCAP 151* 127* 80    Assessment/Plan: S/P Procedure(s) (LRB): MINIMALLY INVASIVE MITRAL VALVE REPAIR (MVR) using a 30 Sorin Memo 3D Mitral Valve Ring (Right) TRANSESOPHAGEAL ECHOCARDIOGRAM (TEE) (N/A) Mobilize Diuresis Diabetes control Plan for transfer to step-down: see transfer orders   LOS: 3 days    Hannah Fitzgerald 07/31/2016

## 2016-08-01 ENCOUNTER — Inpatient Hospital Stay (HOSPITAL_COMMUNITY): Payer: Medicare Other

## 2016-08-01 LAB — BASIC METABOLIC PANEL
Anion gap: 6 (ref 5–15)
BUN: 11 mg/dL (ref 6–20)
CO2: 30 mmol/L (ref 22–32)
Calcium: 8.4 mg/dL — ABNORMAL LOW (ref 8.9–10.3)
Chloride: 105 mmol/L (ref 101–111)
Creatinine, Ser: 0.73 mg/dL (ref 0.44–1.00)
GFR calc Af Amer: >60 mL/min (ref >60)
GFR calc non Af Amer: >60 mL/min (ref >60)
Glucose, Bld: 122 mg/dL — ABNORMAL HIGH (ref 65–99)
Potassium: 3.6 mmol/L (ref 3.5–5.1)
Sodium: 141 mmol/L (ref 135–145)

## 2016-08-01 LAB — TYPE AND SCREEN
ABO/RH(D): O POS
ANTIBODY SCREEN: POSITIVE
UNIT DIVISION: 0
Unit division: 0

## 2016-08-01 LAB — CBC
HCT: 34.4 % — ABNORMAL LOW (ref 36.0–46.0)
Hemoglobin: 11.7 g/dL — ABNORMAL LOW (ref 12.0–15.0)
MCH: 31.7 pg (ref 26.0–34.0)
MCHC: 34 g/dL (ref 30.0–36.0)
MCV: 93.2 fL (ref 78.0–100.0)
Platelets: 136 10*3/uL — ABNORMAL LOW (ref 150–400)
RBC: 3.69 MIL/uL — ABNORMAL LOW (ref 3.87–5.11)
RDW: 14.5 % (ref 11.5–15.5)
WBC: 10.5 10*3/uL (ref 4.0–10.5)

## 2016-08-01 LAB — BPAM RBC
BLOOD PRODUCT EXPIRATION DATE: 201803112359
BLOOD PRODUCT EXPIRATION DATE: 201803112359
Unit Type and Rh: 5100
Unit Type and Rh: 5100

## 2016-08-01 LAB — GLUCOSE, CAPILLARY
GLUCOSE-CAPILLARY: 102 mg/dL — AB (ref 65–99)
Glucose-Capillary: 92 mg/dL (ref 65–99)
Glucose-Capillary: 93 mg/dL (ref 65–99)
Glucose-Capillary: 105 mg/dL — ABNORMAL HIGH (ref 65–99)

## 2016-08-01 LAB — PROTIME-INR
INR: 1.57
Prothrombin Time: 18.9 seconds — ABNORMAL HIGH (ref 11.4–15.2)

## 2016-08-01 MED ORDER — ENSURE ENLIVE PO LIQD
237.0000 mL | Freq: Two times a day (BID) | ORAL | Status: DC
  Administered 2016-08-02 – 2016-08-03 (×3): 237 mL via ORAL

## 2016-08-01 MED ORDER — WARFARIN SODIUM 2.5 MG PO TABS
2.5000 mg | ORAL_TABLET | Freq: Every day | ORAL | Status: DC
  Administered 2016-08-01 – 2016-08-02 (×2): 2.5 mg via ORAL
  Filled 2016-08-01 (×2): qty 1

## 2016-08-01 MED ORDER — POTASSIUM CHLORIDE CRYS ER 20 MEQ PO TBCR
20.0000 meq | EXTENDED_RELEASE_TABLET | Freq: Four times a day (QID) | ORAL | Status: DC
  Administered 2016-08-01: 20 meq via ORAL
  Filled 2016-08-01: qty 1

## 2016-08-01 MED ORDER — POTASSIUM CHLORIDE CRYS ER 20 MEQ PO TBCR
40.0000 meq | EXTENDED_RELEASE_TABLET | Freq: Once | ORAL | Status: AC
  Administered 2016-08-01: 40 meq via ORAL
  Filled 2016-08-01: qty 2

## 2016-08-01 MED ORDER — WARFARIN SODIUM 5 MG PO TABS: 5.0000 mg | ORAL_TABLET | Freq: Every day | ORAL | Status: DC

## 2016-08-01 MED ORDER — POTASSIUM CHLORIDE 20 MEQ/15ML (10%) PO SOLN: 20.0000 meq | Freq: Four times a day (QID) | ORAL | Status: DC

## 2016-08-01 MED ORDER — FUROSEMIDE 40 MG PO TABS
40.0000 mg | ORAL_TABLET | Freq: Every day | ORAL | Status: DC
  Administered 2016-08-02 – 2016-08-03 (×2): 40 mg via ORAL
  Filled 2016-08-01 (×2): qty 1

## 2016-08-01 NOTE — Care Management Note (Signed)
Case Management Note  Patient Details  Name: Hannah Fitzgerald MRN: [id number] Date of Birth: Aug 30, 1945  Subjective/Objective:    Pt lives with her sister who will provide 24/7 assistance as needed.  Ambulating with staff, reports she has a walker and wheelchair @ home.                       Expected Discharge Plan:  Home/Self Care  Discharge planning Services  CM Consult  Status of Service:  In process, will continue to follow  Magdalene RiverMayo, Alphonso Gregson T, RN 08/01/2016, 9:50 AM

## 2016-08-01 NOTE — Progress Notes (Signed)
Initial Nutrition Assessment  DOCUMENTATION CODES:   Severe malnutrition in context of acute illness/injury  INTERVENTION:  Ensure Enlive BID between meals. Each supplement provides 350 kcal and 20 grams.   Provided patient with "Vitamin K and Medications" handout from [employer].   NUTRITION DIAGNOSIS:   Malnutrition related to acute illness as evidenced by energy intake < or equal to 50% for > or equal to 5 days, moderate depletion of body fat, moderate depletions of muscle mass.  GOAL:   Patient will meet greater than or equal to 90% of their needs  MONITOR:   PO intake, Supplement acceptance, I & O's, Labs, Weight trends  REASON FOR ASSESSMENT:   Other (Comment) (ICU, poor PO)    ASSESSMENT:   71 yo female with s/p MVR, PMHx significant of cardiac cath, hypothyroidism, heart murmur, disease of thyroid gland and dyslipidemia.  Patient reported weight has been stable around 100-105 lb over the past 4 years since her husband died. Prior to the loss of her husband, she stated weight was usually 110-112 lb. Patient reported inability to regain weight.  Per patient chart PO intake since 3/3 has been 5-25%. Patient reported she does not like the food served. Patient reported appetite PTA had been stable and typically consisted of 2 meals per day with a snack before bed. Typical breakfast consisted of a bagel with butter or oatmeal with blueberries. Dinner was often pasta or chicken with baked potato or vegetables. Patient reported she typically consumes ice cream 3 hours after dinner.   Nutrition focused physical exam performed. Findings include moderate muscle and moderate fat depletion.   Patient reported she had not been drinking Ensure or Boost PTA due to fear of Vitamin K amount in supplement. Provided patient with "Vitamin K and Medications" handout and recommended patient consume Ensure Enlive supplement BID between meals due to significantly low PO  intake. Reassured patient vitamin K content of Ensure was not significant enough to contraindicate Warfarin use. Patient reported she had consumed Ensure in the past and really likes this supplement and wants to continue consumption once d/c.  Patient reported home supplementation includes: Calcium, MVI, Fish Oil, Magnesium.  Meds Reviewed: Colace, Lasix, Potassium Chloride, Prvastatin, Warfarin  Labs Reviewed: Glucose 122, calcium 8.4  Diet Order:  Diet heart healthy/carb modified Room service appropriate? Yes; Fluid consistency: Thin  Skin:  Reviewed, no issues  Last BM:  07/31/2016  Height:   Ht Readings from Last 1 Encounters:  08/01/16 5\' 3"  (1.6 m)    Weight:   Wt Readings from Last 1 Encounters:  08/01/16 105 lb 13.1 oz (48 kg)    Ideal Body Weight:  52 kg  BMI:  Body mass index is 18.75 kg/m.  Estimated Nutritional Needs:   Kcal:  1200-1400  Protein:  70-80 grams  Fluid:  >/=1.5 L/day  EDUCATION NEEDS:   Education needs addressed  Hannah Fitzgerald M.S. Nutrition Dietetic Intern

## 2016-08-01 NOTE — Progress Notes (Signed)
D/C'd epw per protocol and MD order. Wires intact. No bleeding observed. Pt on bedrest for one hour. VSS throughout procedure and will be obtain [id number]. Call bell within reach. Will continue to monitor pt.

## 2016-08-01 NOTE — Progress Notes (Signed)
DC'd chest tubes per MD order and protocol.  Hermina BartersBOWMAN, Seng Fouts M, RN

## 2016-08-01 NOTE — Care Management Note (Signed)
Case Management Note  Patient Details  Name: Hannah SpeedJanet M Levins MRN: [id number] Date of Birth: 07-04-1945  Subjective/Objective:   Pt lives with her sister who will be available 24/7 to provide any assistance she needs.  Has walker and wheelchair @ home.  Per staff, doing well ambulating on unit.                         Expected Discharge Plan:  Home/Self Care  Discharge planning Services  CM Consult  Status of Service:  In process, will continue to follow  Magdalene RiverMayo, Lygia Olaes T, RN 08/01/2016, 8:50 AM

## 2016-08-01 NOTE — Progress Notes (Signed)
      [address]       [place] [id number]             [phone]        CARDIOTHORACIC SURGERY PROGRESS NOTE   R4 Days Post-Op Procedure(s) (LRB): MINIMALLY INVASIVE MITRAL VALVE REPAIR (MVR) using a 30 Sorin Memo 3D Mitral Valve Ring (Right) TRANSESOPHAGEAL ECHOCARDIOGRAM (TEE) (N/A)  Subjective: No complaints.  Minimal pain.  No SOB  Objective: Vital signs: BP Readings from Last 1 Encounters:  08/01/16 113/62   Pulse Readings from Last 1 Encounters:  08/01/16 (!) 59   Resp Readings from Last 1 Encounters:  08/01/16 17   Temp Readings from Last 1 Encounters:  08/01/16 98.3 F (36.8 C) (Oral)    Hemodynamics:    Physical Exam:  Rhythm:   sinus  Breath sounds: clear  Heart sounds:  RRR w/out murmur  Incisions:  Clean and dry  Abdomen:  Soft, non-distended, non-tender  Extremities:  Warm, well-perfused  Chest tubes:  low volume thin serosanguinous output, no air leak    Intake/Output from previous day: 03/04 0701 - 03/05 0700 In: 480 [P.O.:480] Out: 2480 [Urine:2240; Chest Tube:240] Intake/Output this shift: Total I/O In: -  Out: 100 [Urine:100]  Lab Results:  CBC: Recent Labs  07/31/16 0345 08/01/16 0253  WBC 12.7* 10.5  HGB 11.0* 11.7*  HCT 32.5* 34.4*  PLT 97* 136*    BMET:  Recent Labs  07/31/16 0345 08/01/16 0253  NA 139 141  K 3.7 3.6  CL 105 105  CO2 26 30  GLUCOSE 93 122*  BUN 14 11  CREATININE 0.77 0.73  CALCIUM 8.3* 8.4*     PT/INR:   Recent Labs  08/01/16 0253  LABPROT 18.9*  INR 1.57    CBG (last 3)   Recent Labs  07/31/16 1632 07/31/16 2205 08/01/16 0814  GLUCAP 90 90 92    ABG    Component Value Date/Time   PHART 7.368 07/29/2016 0313   PCO2ART 41.0 07/29/2016 0313   PO2ART 118 (H) 07/29/2016 0313   HCO3 23.0 07/29/2016 0313   TCO2 24 07/29/2016 1632   ACIDBASEDEF 1.5 07/29/2016 0313   O2SAT 98.1 07/29/2016 0313    CXR: CHEST  2 VIEW  COMPARISON:   07/31/2016  FINDINGS: The right-sided chest tube is stable. No pneumothorax. There is a mediastinal drain tube in place also. Epicardial pacer wires noted. The heart is normal in size. The mediastinal and hilar contours are normal. There is a small left pleural effusion and minimal overlying atelectasis.  IMPRESSION: Right-sided chest tube in place.  No pneumothorax.  Small left pleural effusion and minimal left basilar atelectasis.   Electronically Signed   By: Rudie MeyerP.  Gallerani M.D.   On: 08/01/2016 07:56  Assessment/Plan: S/P Procedure(s) (LRB): MINIMALLY INVASIVE MITRAL VALVE REPAIR (MVR) using a 30 Sorin Memo 3D Mitral Valve Ring (Right) TRANSESOPHAGEAL ECHOCARDIOGRAM (TEE) (N/A)  Doing well POD4 Maintaining NSR w/ stable BP Breathing comfortably on room air   D/C tubes and wires  Mobilize  Anticipate likely D/C home tomorrow  Purcell Nailslarence H Yides Saidi, MD 08/01/2016 9:17 AM

## 2016-08-02 ENCOUNTER — Inpatient Hospital Stay (HOSPITAL_COMMUNITY): Payer: Medicare Other

## 2016-08-02 LAB — GLUCOSE, CAPILLARY: Glucose-Capillary: 106 mg/dL — ABNORMAL HIGH (ref 65–99)

## 2016-08-02 LAB — PROTIME-INR
INR: 1.37
PROTHROMBIN TIME: 17 s — AB (ref 11.4–15.2)

## 2016-08-02 MED ORDER — AMIODARONE HCL 200 MG PO TABS
200.0000 mg | ORAL_TABLET | Freq: Two times a day (BID) | ORAL | Status: DC
  Administered 2016-08-02 (×2): 200 mg via ORAL
  Filled 2016-08-02 (×2): qty 1

## 2016-08-02 NOTE — Care Management Important Message (Signed)
Important Message  Patient Details  Name: Hannah Fitzgerald MRN: [id number] Date of Birth: Mar 29, 1946   Medicare Important Message Given:  Yes    Draiden Mirsky Abena 08/02/2016, 11:11 AM

## 2016-08-02 NOTE — Progress Notes (Addendum)
      [address]       [place] [id number]             [phone]        5 Days Post-Op Procedure(s) (LRB): MINIMALLY INVASIVE MITRAL VALVE REPAIR (MVR) using a 30 Sorin Memo 3D Mitral Valve Ring (Right) TRANSESOPHAGEAL ECHOCARDIOGRAM (TEE) (N/A)  Subjective: Patient concerned this am about heart racing and pounding at times.  Objective: Vital signs in last 24 hours: Temp:  [98 F (36.7 C)-100 F (37.8 C)] 98.6 F (37 C) (03/06 0435) Pulse Rate:  [49-76] 76 (03/06 0435) Cardiac Rhythm: Normal sinus rhythm (03/06 0435) Resp:  [16-27] 18 (03/06 0435) BP: (100-131)/(48-86) 103/66 (03/06 0435) SpO2:  [79 %-100 %] 97 % (03/06 0435) Weight:  [102 lb 9.6 oz (46.5 kg)] 102 lb 9.6 oz (46.5 kg) (03/06 0435)  Pre op weight 44.5 kg Current Weight  08/02/16 102 lb 9.6 oz (46.5 kg)      Intake/Output from previous day: 03/05 0701 - 03/06 0700 In: -  Out: 450 [Urine:450]   Physical Exam:  Cardiovascular: RRR, no murmur Pulmonary: Clear to auscultation bilaterally Abdomen: Soft, non tender, bowel sounds present. Extremities: No lower extremity edema. Wounds: Clean and dry.  No erythema or signs of infection. Ecchymosis right chest wall. Chest tube dressing has bloody ooze on it.  Lab Results: CBC: Recent Labs  07/31/16 0345 08/01/16 0253  WBC 12.7* 10.5  HGB 11.0* 11.7*  HCT 32.5* 34.4*  PLT 97* 136*   BMET:  Recent Labs  07/31/16 0345 08/01/16 0253  NA 139 141  K 3.7 3.6  CL 105 105  CO2 26 30  GLUCOSE 93 122*  BUN 14 11  CREATININE 0.77 0.73  CALCIUM 8.3* 8.4*    PT/INR:  Lab Results  Component Value Date   INR 1.37 08/02/2016   INR 1.57 08/01/2016   INR 1.98 07/31/2016   ABG:  INR: Will add last result for INR, ABG once components are confirmed Will add last 4 CBG results once components are confirmed  Assessment/Plan:  1. CV - She had runs of a fib with RVR over night. SR/first degree heart block in the 70's. On Coumadin.  INR decreased from 1.57 to 1.37. Will give 2.5 mg of Coumadin. Will discuss with Dr. Cornelius Moraswen if should restart oral Amiodarone. 2.  Pulmonary - On room air. CXR this am appears to show no pneumothorax or effusions.Encourage incentive spirometer. 3. Volume Overload - On Lasix 40 mg daily 4.  Acute blood loss anemia - H and H yesterday stable at 11.7 and 34.4. Continue Niferex. 5. Mild thrombocytopenia-platelets yesterday up to 136,000  ZIMMERMAN,DONIELLE MPA-C 08/02/2016,7:22 AM  I have seen and examined the patient and agree with the assessment and plan as outlined.  Restart amiodarone 200 mg BID.  Hold D/C at least 1 more day and monitor rhythm.  Purcell Nailslarence H Owen, MD 08/02/2016 9:20 AM

## 2016-08-02 NOTE — Progress Notes (Signed)
CARDIAC REHAB PHASE I   PRE:  Rate/Rhythm: 72 SR 1HB  BP:  Supine:   Sitting: 111/52  Standing:    SaO2: 97%RA  MODE:  Ambulation: 550 ft   POST:  Rate/Rhythm: to 135 afib during walk, to NSR sitting in recliner back to 135 afib  BP:  Supine:   Sitting: 110/57  Standing:    SaO2: 100%RA 1042-1110 Pt walked 550 ft with very little assistance with steady gait. Tolerated well. No c/o palpitations or SOB. In and out of atrial fib during walk. To recliner after walk.   Luetta Nuttingharlene Shelbe Haglund, RN BSN  08/02/2016 11:19 AM

## 2016-08-02 NOTE — Discharge Instructions (Signed)
Activity: 1.May walk up steps                2.No lifting more than ten pounds for two weeks.                 3.No driving for two weeks.                4.Stop any activity that causes chest pain, shortness of breath, dizziness, sweating or excessive weakness.                5.Avoid straining.                6.Continue with your breathing exercises daily.  Diet: Low fat, Low salt diet  Wound Care: May shower.  Clean wounds with mild soap and water daily. Contact the office at [phone] if any problems arise.  ===================================================================================================================  Information on my medicine - Coumadin   (Warfarin)  This medication education was reviewed with me or my healthcare representative as part of my discharge preparation.  The pharmacist that spoke with me during my hospital stay was:  Dennie Fettersgan, Aid[facility] Endicott Donovan, [facility]  Why was Coumadin prescribed for you? Coumadin was prescribed for you because you have a blood clot or a medical condition that can cause an increased risk of forming blood clots. Blood clots can cause serious health problems by blocking the flow of blood to the heart, lung, or brain. Coumadin can prevent harmful blood clots from forming. As a reminder your indication for Coumadin is:   Blood Clotting Disorder and stroke prevention because of atrial fibrillation What test will check on my response to Coumadin? While on Coumadin (warfarin) you will need to have an INR test regularly to ensure that your dose is keeping you in the desired range. The INR (international normalized ratio) number is calculated from the result of the laboratory test called prothrombin time (PT).  If an INR APPOINTMENT HAS NOT ALREADY BEEN MADE FOR YOU please schedule an appointment to have this lab work done by your health care provider within 7 days. Your INR goal is usually a number between:  2 to 3 or your provider may give you a more  narrow range like 2-2.5.  Ask your health care provider during an office visit what your goal INR is.  What  do you need to  know  About  COUMADIN? Take Coumadin (warfarin) exactly as prescribed by your healthcare provider about the same time each day.  DO NOT stop taking without talking to the doctor who prescribed the medication.  Stopping without other blood clot prevention medication to take the place of Coumadin may increase your risk of developing a new clot or stroke.  Get refills before you run out.  What do you do if you miss a dose? If you miss a dose, take it as soon as you remember on the same day then continue your regularly scheduled regimen the next day.  Do not take two doses of Coumadin at the same time.  Important Safety Information A possible side effect of Coumadin (Warfarin) is an increased risk of bleeding. You should call your healthcare provider right away if you experience any of the following: ? Bleeding from an injury or your nose that does not stop. ? Unusual colored urine (red or dark brown) or unusual colored stools (red or black). ? Unusual bruising for unknown reasons. ? A serious fall or if you hit your head (even if there is no bleeding).  Some foods or medicines interact with Coumadin (warfarin) and might alter your response to warfarin. To help avoid this: ? Eat a balanced diet, maintaining a consistent amount of Vitamin K. ? Notify your provider about major diet changes you plan to make. ? Avoid alcohol or limit your intake to 1 drink for women and 2 drinks for men per day. (1 drink is 5 oz. wine, 12 oz. beer, or 1.5 oz. liquor.)  Make sure that ANY health care provider who prescribes medication for you knows that you are taking Coumadin (warfarin).  Also make sure the healthcare provider who is monitoring your Coumadin knows when you have started a new medication including herbals and non-prescription products.  Coumadin (Warfarin)  Major Drug  Interactions  Increased Warfarin Effect Decreased Warfarin Effect  Alcohol (large quantities) Antibiotics (esp. Septra/Bactrim, Flagyl, Cipro) Amiodarone (Cordarone) Aspirin (ASA) Cimetidine (Tagamet) Megestrol (Megace) NSAIDs (ibuprofen, naproxen, etc.) Piroxicam (Feldene) Propafenone (Rythmol SR) Propranolol (Inderal) Isoniazid (INH) Posaconazole (Noxafil) Barbiturates (Phenobarbital) Carbamazepine (Tegretol) Chlordiazepoxide (Librium) Cholestyramine (Questran) Griseofulvin Oral Contraceptives Rifampin Sucralfate (Carafate) Vitamin K   Coumadin (Warfarin) Major Herbal Interactions  Increased Warfarin Effect Decreased Warfarin Effect  Garlic Ginseng Ginkgo biloba Coenzyme Q10 Green tea St. Johns wort    Coumadin (Warfarin) FOOD Interactions  Eat a consistent number of servings per week of foods HIGH in Vitamin K (1 serving =  cup)  Collards (cooked, or boiled & drained) Kale (cooked, or boiled & drained) Mustard greens (cooked, or boiled & drained) Parsley *serving size only =  cup Spinach (cooked, or boiled & drained) Swiss chard (cooked, or boiled & drained) Turnip greens (cooked, or boiled & drained)  Eat a consistent number of servings per week of foods MEDIUM-HIGH in Vitamin K (1 serving = 1 cup)  Asparagus (cooked, or boiled & drained) Broccoli (cooked, boiled & drained, or raw & chopped) Brussel sprouts (cooked, or boiled & drained) *serving size only =  cup Lettuce, raw (green leaf, endive, romaine) Spinach, raw Turnip greens, raw & chopped   These websites have more information on Coumadin (warfarin):  http://www.king-russell.com/; https://www.hines.net/;

## 2016-08-02 NOTE — Discharge Summary (Signed)
Physician Discharge Summary       [address]       [place], [address]             [phone]    Patient ID: Hannah Fitzgerald MRN: [id number] DOB/AGE: 11/12/1945 71 y.o.  Admit date: 07/28/2016 Discharge date: 08/04/2016  Admission Diagnoses: Severe mitral regurgitation  Active Diagnoses:  1. Lupus anticoagulant positive 2. Clotting disorder ([facility]) 3. Dyslipidemia 4. Disease of the thyroid gland 5. ABL anemia 6. PAF  Procedure (s):   Minimally-Invasive Mitral Valve Repair             Complex valvuloplasty including quadrangular resection of flail segment of posterior leaflet             Artificial Gore-tex neochord placement x6             Sorin Memo 3D Ring Annuloplasty (size 30 mm, catalog # [id number], serial # [id number]) by Dr. Roxy Manns 07/28/2016.  History of Presenting Illness: Patient is a 71 year old female with reported history of mitral valve prolapse who has been referred for surgical consultation to discuss treatment options for management of severe mitral regurgitation. The patient states that she was told that she had mitral valve prolapse many years ago. She has never been formally evaluated by a cardiologist until recently. In 2006 the patient was hospitalized briefly at [facility] what was felt to be a possible embolic event to her right thumb. Prior to that the patient had reportedly experienced a similar episode of sudden onset of blue discoloration to the third finger of the right hand several months previously for which she has not seen medical attention. 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 She was started on Coumadin and transferred to [facility] in [place]. She was felt to have suffered a likely embolic event. According to her recollection no echocardiogram was done. She was evaluated for possible hypercoagulable state and  found to have abnormal Lupus anticoagulant. She has been on chronic warfarin anticoagulation ever since. The patient has not had any further episodes of possible embolic events and she has no documented history of atrial fibrillation. She recently complained to her primary care physician that she noted that if she climbed a flight of stairs she would note that her heart was pounding and racing faster than she thought appropriate. She denied any symptoms of exertional shortness of breath. Her primary care physician noted a loud holosystolic murmur on physical exam and referred her for a transthoracic echocardiogram and cardiology evaluation. Echocardiogram performed 04/04/2016 in [place] was found to reveal normal left ventricular systolic function with mitral valve prolapse and "moderate mitral regurgitation". There was severe left atrial enlargement. No other significant abnormalities were noted. The patient subsequently underwent left and right heart catheterization by Dr. Donell Beers 06/08/2016. The patient was found to have normal coronary arteries with normal left ventricular systolic function and normal right heart pressures. There was severe mitral regurgitation appreciated at catheterization. The patient was referred for elective surgical consultation.  The patient is widowed and lives in [place] with her sister. She has been relatively healthy for all of her life and she reports no significant physical limitations. She remains entirely functionally independent. However, she does not exercise on a regular basis and she does not walk regularly. She specifically denies any symptoms of exertional shortness of breath or chest discomfort. She denies any history of irregular heart rhythm,  dizzy spells, orthopnea, lower extremity edema, or syncope. She has remained on Coumadin since 2006 with no bleeding complications.  The patient and her sister werecounseled at length regarding the indications, risks and  potential benefits of mitral valve repair. The rationale for elective surgery has been explained, including a comparison between surgery and continued medical therapy with close follow-up. The likelihood of successful and durable valve repair has been discussed with particular reference to the findings of their recent echocardiogram. Based upon these findings and previous experience, I have quoted them a greater than 95percent likelihood of successful valve repair. The patient understands and accepts all potential risks of surgery. Pre operative carotid duplex showed no significant internal carotid stenosis bilaterally. She was admitted to [facility] on 07/28/2016 in order to undergo a minimally invasive mitral valve repair.  Brief Hospital Course:  The patient was extubated late the evening of surgery without difficulty. She remained afebrile and hemodynamically stable. She was initially AAI paced and on low dose Neo Synephrine and Dopamine drips. She was gradually weaned off of the drips. Gordy Councilman, a line, and foley were removed early in the post operative course. Chest tubes remained for a few days then were removed on 03/05. Epicardial pacing wires were removed on this date prior to chest tubes. She was not started on a beta blocker because of junctional rhythm, bradycardia, and labile blood pressure.  She was started on Coumadin. PT and INR were monitored daily. She is currently on 4 mg of Coumadin. Her last INR was 1.23.  She will have to call Dr. Wendy Poet office to have a PT and INR drawn 48 hours after discharge. She was volume over loaded and diuresed. She had ABL anemia. She did require a post op transfusion. Last H and H was 11.7. She was weaned off the insulin drip.  The patient's HGA1C pre op was 5.3. The patient was felt surgically stable for transfer from the ICU to [facility] for further convalescence on 08/01/2016. She did have intermittent a fib with RVR . She was restarted on oral  Amiodarone 200 mg bid. However she developed A. Flutter.  She was re-bolused with IV Amiodarone and she converted to NSR.  She continues to progress with cardiac rehab. She was ambulating on room air. She has been tolerating a diet and has had a bowel movement. Epicardial pacing wires were removed on 08/01/2016. Chest tube sutures will be removed the day of discharge. The patient is felt surgically stable for discharge today.    Latest Vital Signs: Blood pressure (!) 102/48, pulse (!) 59, temperature 97.5 F (36.4 C), temperature source Oral, resp. rate 18, height '5\' 3"'$  (1.6 m), weight 103 lb 8 oz (46.9 kg), SpO2 97 %.  Physical Exam: Cardiovascular: RRR, no murmur Pulmonary: Clear to auscultation bilaterally Abdomen: Soft, non tender, bowel sounds present. Extremities: No lower extremity edema. Wounds: Clean and dry.  No erythema or signs of infection. Ecchymosis right chest wall. Chest tube dressing has bloody ooze on it.  Discharge Condition: Stable and discharge to home.  Recent laboratory studies:  Lab Results  Component Value Date   WBC 10.5 08/01/2016   HGB 11.7 (L) 08/01/2016   HCT 34.4 (L) 08/01/2016   MCV 93.2 08/01/2016   PLT 136 (L) 08/01/2016   Lab Results  Component Value Date   NA 139 08/03/2016   K 3.9 08/03/2016   CL 102 08/03/2016   CO2 29 08/03/2016   CREATININE 0.74 08/03/2016   GLUCOSE 88 08/03/2016  Diagnostic Studies: Dg Chest 2 View  Result Date: 08/02/2016 CLINICAL DATA:  Palpitations since having mitral valve repair. History of CHF. No report of shortness of breath or chest pain. Remote history of smoking. EXAM: CHEST  2 VIEW COMPARISON:  Chest x-ray of August 01, 2016 FINDINGS: The lungs remain hyperinflated with hemidiaphragm flattening. There is a small left pleural effusion versus pleural scarring which is stable. There is no alveolar infiltrate. The heart and pulmonary vascularity are normal. The mitral valve repair ring is visible. There is  faint calcification in the wall of the aortic arch. The mediastinum and normal in width. The bony thorax exhibits no acute abnormality. IMPRESSION: COPD. No CHF or pneumonia. Stable pleural thickening or pleural fluid at the left lung base. Thoracic aortic atherosclerosis. Electronically Signed   By: David  Swaziland M.D.   On: 08/02/2016 08:02   Dg Chest 2 View  Result Date: 08/01/2016 CLINICAL DATA:  Status post mitral valve replacement. EXAM: CHEST  2 VIEW COMPARISON:  07/31/2016 FINDINGS: The right-sided chest tube is stable. No pneumothorax. There is a mediastinal drain tube in place also. Epicardial pacer wires noted. The heart is normal in size. The mediastinal and hilar contours are normal. There is a small left pleural effusion and minimal overlying atelectasis. IMPRESSION: Right-sided chest tube in place.  No pneumothorax. Small left pleural effusion and minimal left basilar atelectasis. Electronically Signed   By: Rudie Meyer M.D.   On: 08/01/2016 07:56   Dg Chest 2 View  Result Date: 07/26/2016 CLINICAL DATA:  Preop for mitral valve repair EXAM: CHEST  2 VIEW COMPARISON:  Chest x-ray of 05/25/2016 FINDINGS: The lungs are clear and somewhat hyperaerated. Mediastinal and hilar contours are unremarkable. The heart is within normal limits in size. No bony abnormality is seen. IMPRESSION: No active cardiopulmonary disease. No change in slight hyper aeration. Electronically Signed   By: Dwyane Dee M.D.   On: 07/26/2016 09:04   Dg Chest Port 1 View  Result Date: 07/31/2016 CLINICAL DATA:  Status post mitral valve repair. EXAM: PORTABLE CHEST 1 VIEW COMPARISON:  07/30/2016 FINDINGS: Sequelae of mitral valve repair are again identified. Left jugular sheath is unchanged, as are a right-sided chest tube and a mediastinal drain. The cardiac silhouette is normal in size. No pneumothorax is identified. There is minimal right basilar opacity which has slightly improved. There is streaky left basilar opacity  which demonstrates more substantial improvement. There may be a tiny residual left pleural effusion. IMPRESSION: Improved aeration of the lung bases with residual left greater than right basilar atelectasis. No pneumothorax. Electronically Signed   By: Sebastian Ache M.D.   On: 07/31/2016 08:18   Dg Chest Port 1 View  Result Date: 07/30/2016 CLINICAL DATA:  Atelectasis. EXAM: PORTABLE CHEST 1 VIEW COMPARISON:  07/29/2016 FINDINGS: Swan-Ganz catheter has been removed. Left jugular sheath terminates near the upper SVC/ confluence of the brachiocephalic veins. Right chest tube and mediastinal drain remain in place. Sequelae of mitral valve repair are again identified. The cardiomediastinal silhouette is unchanged. No definite pneumothorax is identified. There is a persistent small left pleural effusion with mild left greater than right basilar lung opacities. There may be a trace right pleural effusion remaining. IMPRESSION: 1. Interval removal of Swan-Ganz catheter. Other support devices as above. No pneumothorax. 2. Persistent mild bibasilar atelectasis and small left pleural effusion. Electronically Signed   By: Sebastian Ache M.D.   On: 07/30/2016 08:46   Dg Chest Port 1 View  Result Date: 07/29/2016  CLINICAL DATA:  Intubation. EXAM: PORTABLE CHEST 1 VIEW COMPARISON:  07/28/2016. FINDINGS: Interim extubation and removal of NG tube. Swan-Ganz catheter and mediastinal drainage catheter in stable position. Right chest tube in stable position. No pneumothorax. Prior cardiac valve replacement. Heart size stable. Basilar atelectasis. Small bilateral pleural effusions. IMPRESSION: 1. Interim extubation and removal of NG tube. Remaining lines and tubes including right chest tube in stable position. No pneumothorax. 2. Low lung volumes with mild basilar atelectasis. Small bilateral pleural effusions. Electronically Signed   By: Maisie Fus  Register   On: 07/29/2016 07:42   Dg Chest Port 1 View  Result Date:  07/28/2016 CLINICAL DATA:  Atelectasis, postop EXAM: PORTABLE CHEST 1 VIEW COMPARISON:  07/26/2016 FINDINGS: Right chest tube in place without pneumothorax. Endotracheal tube is 5.6 cm above the carina. Swan-Ganz catheter in the main right pulmonary artery. NG tube tip is in the distal esophagus. Areas of atelectasis noted in the right lung base. No confluent opacity on the left. Heart is normal size. IMPRESSION: Postoperative changes with right chest tube in place. No pneumothorax. Areas of right base atelectasis. NG tube tip is in the distal esophagus. Electronically Signed   By: Charlett Nose M.D.   On: 07/28/2016 15:08   Ct Angio Chest Aorta W &/or Wo Contrast  Result Date: 07/26/2016 CLINICAL DATA:  Preoperative assessment prior to mitral valve repair surgery for severe mitral regurgitation. EXAM: CT ANGIOGRAPHY CHEST, ABDOMEN AND PELVIS TECHNIQUE: Multidetector CT imaging through the chest, abdomen and pelvis was performed using the standard protocol during bolus administration of intravenous contrast. Multiplanar reconstructed images and MIPs were obtained and reviewed to evaluate the vascular anatomy. CONTRAST:  75 mL Isovue 370 IV Creatinine was obtained on site at [facility] at [address]. Results: Creatinine 0.8 mg/dL.  GFR 74 mL/minute COMPARISON:  None. FINDINGS: CTA CHEST FINDINGS Cardiovascular: The thoracic aorta shows normal caliber and patency without evidence of significant atherosclerosis, aneurysmal disease or dissection. Proximal great vessels show normal branching anatomy and normal patency. The heart is top-normal in size. No evidence of pericardial fluid. No significant calcified plaque is seen in the distribution of the coronary arteries. Mediastinum/Nodes: No enlarged mediastinal, hilar, or axillary lymph nodes. Thyroid gland, trachea, and esophagus demonstrate no significant findings. Lungs/Pleura: Both lungs demonstrate mild bibasilar scarring. There is no evidence of  pulmonary edema, consolidation, pneumothorax, nodule or pleural fluid. Musculoskeletal: Bony structures are unremarkable. Review of the MIP images confirms the above findings. CTA ABDOMEN AND PELVIS FINDINGS VASCULAR Aorta: The abdominal aorta shows a mild amount of calcified plaque without evidence of aneurysmal disease or stenosis. Celiac: Normally patent. SMA: Normally patent. Renals: Single right renal artery and 2 separate left renal artery show normal patency. No evidence of renal artery stenosis. The distal segments of the main right renal artery and dominant left renal artery demonstrate beading and mild irregularity consistent with fibromuscular dysplasia. No associated aneurysms or evidence of renal artery dissection. IMA: Normally patent. Inflow: Bilateral common iliac arteries demonstrate normal patency. Internal iliac arteries also demonstrate normal patency. There is irregularity and beaded appearance of the proximal to mid left external iliac artery consistent with fibromuscular dysplasia. The right external iliac artery also shows mild irregularity in its proximal segment likely consistent with mild fibromuscular disease. No associated dissection or aneurysmal disease. Common femoral arteries and femoral bifurcations are normally patent. Review of the MIP images confirms the above findings. NON-VASCULAR Hepatobiliary: No focal liver abnormality is seen. No gallstones, gallbladder wall thickening, or biliary  dilatation. Pancreas: Unremarkable. No pancreatic ductal dilatation or surrounding inflammatory changes. Spleen: Normal in size without focal abnormality. Adrenals/Urinary Tract: Adrenal glands are unremarkable. Kidneys are normal, without renal calculi, focal lesion, or hydronephrosis. Bladder is unremarkable. Stomach/Bowel: Bowel is within normal limits and shows no evidence of inflammation, lesion or obstruction. No free air or abnormal fluid collections identified. Lymphatic: No enlarged lymph  nodes seen. Reproductive: Uterus and bilateral adnexa are unremarkable. Other: No abdominal wall hernia or abnormality. No abdominopelvic ascites. Musculoskeletal: No acute or significant osseous findings. Review of the MIP images confirms the above findings. IMPRESSION: 1. 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 No associated aneurysmal disease or arterial dissections. 2. No other significant vascular findings in the chest, abdomen or pelvis. Electronically Signed   By: Aletta Edouard M.D.   On: 07/26/2016 15:59   Ct Angio Abd/pel W/ And/or W/o  Result Date: 07/26/2016 CLINICAL DATA:  Preoperative assessment prior to mitral valve repair surgery for severe mitral regurgitation. EXAM: CT ANGIOGRAPHY CHEST, ABDOMEN AND PELVIS TECHNIQUE: Multidetector CT imaging through the chest, abdomen and pelvis was performed using the standard protocol during bolus administration of intravenous contrast. Multiplanar reconstructed images and MIPs were obtained and reviewed to evaluate the vascular anatomy. CONTRAST:  75 mL Isovue 370 IV Creatinine was obtained on site at [place] at [place]. Results: Creatinine 0.8 mg/dL.  GFR 74 mL/minute COMPARISON:  None. FINDINGS: CTA CHEST FINDINGS Cardiovascular: The thoracic aorta shows normal caliber and patency without evidence of significant atherosclerosis, aneurysmal disease or dissection. Proximal great vessels show normal branching anatomy and normal patency. The heart is top-normal in size. No evidence of pericardial fluid. No significant calcified plaque is seen in the distribution of the coronary arteries. Mediastinum/Nodes: No enlarged mediastinal, hilar, or axillary lymph nodes. Thyroid gland, trachea, and esophagus demonstrate no significant findings. Lungs/Pleura: Both lungs demonstrate mild bibasilar scarring. There is no  evidence of pulmonary edema, consolidation, pneumothorax, nodule or pleural fluid. Musculoskeletal: Bony structures are unremarkable. Review of the MIP images confirms the above findings. CTA ABDOMEN AND PELVIS FINDINGS VASCULAR Aorta: The abdominal aorta shows a mild amount of calcified plaque without evidence of aneurysmal disease or stenosis. Celiac: Normally patent. SMA: Normally patent. Renals: Single right renal artery and 2 separate left renal artery show normal patency. No evidence of renal artery stenosis. The distal segments of the main right renal artery and dominant left renal artery demonstrate beading and mild irregularity consistent with fibromuscular dysplasia. No associated aneurysms or evidence of renal artery dissection. IMA: Normally patent. Inflow: Bilateral common iliac arteries demonstrate normal patency. Internal iliac arteries also demonstrate normal patency. There is irregularity and beaded appearance of the proximal to mid left external iliac artery consistent with fibromuscular dysplasia. The right external iliac artery also shows mild irregularity in its proximal segment likely consistent with mild fibromuscular disease. No associated dissection or aneurysmal disease. Common femoral arteries and femoral bifurcations are normally patent. Review of the MIP images confirms the above findings. NON-VASCULAR Hepatobiliary: No focal liver abnormality is seen. No gallstones, gallbladder wall thickening, or biliary dilatation. Pancreas: Unremarkable. No pancreatic ductal dilatation or surrounding inflammatory changes. Spleen: Normal in size without focal abnormality. Adrenals/Urinary Tract: Adrenal glands are unremarkable. Kidneys are normal, without renal calculi, focal lesion, or hydronephrosis. Bladder is unremarkable. Stomach/Bowel: Bowel is within normal limits and shows no evidence of inflammation, lesion or obstruction. No  free air or abnormal fluid collections identified. Lymphatic: No  enlarged lymph nodes seen. Reproductive: Uterus and bilateral adnexa are unremarkable. Other: No abdominal wall hernia or abnormality. No abdominopelvic ascites. Musculoskeletal: No acute or significant osseous findings. Review of the MIP images confirms the above findings. IMPRESSION: 1. 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 No associated aneurysmal disease or arterial dissections. 2. No other significant vascular findings in the chest, abdomen or pelvis. Electronically Signed   By: Irish Lack M.D.   On: 07/26/2016 15:59      Discharge Medications:  The patient has been discharged on:   1.Beta Blocker:  Yes [   ]                              No   [x   ]                              If No, reason: bradycardia, labile BP  2.Ace Inhibitor/ARB: Yes [   ]                                     No  [  x  ]                                     If No, reason: NO CAD, Labile BP  3.Statin:   Yes [   ]                  No  [x   ]                  If No, reason: No CAD  4.Marlowe Kays:  Yes  [ x  ]                  No   [   ]                  If No, reason:      Allergies as of 08/04/2016   No Known Allergies     Medication List    TAKE these medications   ALPRAZolam 0.5 MG tablet Commonly known as:  XANAX Take 0.25 mg by mouth at bedtime as needed for anxiety.   amiodarone 200 MG tablet Commonly known as:  PACERONE Take 1 tablet (200 mg total) by mouth 2 (two) times daily. What changed:  when to take this   aspirin 81 MG EC tablet Take 1 tablet (81 mg total) by mouth daily.   calcium carbonate 1500 (600 Ca) MG Tabs tablet Commonly known as:  OSCAL Take 1,500 mg by mouth 2 (two) times daily with a meal.   Fish Oil 1200 MG Caps Take 2,400 mg by mouth daily.   ibandronate 150 MG tablet Commonly known as:  BONIVA Take 150 mg by mouth every 30  (thirty) days.   levothyroxine 150 MCG tablet Commonly known as:  SYNTHROID, LEVOTHROID Take 1 tablet (150 mcg total) by mouth daily at 10 pm. What changed:  medication strength  how much to take  when to take this   [facility]  PO Take 1 tablet by mouth daily.   MetroNIDAZOLE-Cleanser 0.75 % (Cream) Kit Apply 1 application topically at bedtime.   multivitamin with minerals Tabs tablet Take 1 tablet by mouth daily.   nitrofurantoin (macrocrystal-monohydrate) 100 MG capsule Commonly known as:  MACROBID Take 1 capsule (100 mg total) by mouth every 12 (twelve) hours. For 7 Days   pravastatin 20 MG tablet Commonly known as:  PRAVACHOL Take 20 mg by mouth every evening.   traMADol 50 MG tablet Commonly known as:  ULTRAM Take 1-2 tablets (50-100 mg total) by mouth every 4 (four) hours as needed for moderate pain.   vitamin C 500 MG tablet Commonly known as:  ASCORBIC ACID Take 500 mg by mouth 2 (two) times daily.   warfarin 4 MG tablet Commonly known as:  COUMADIN Take 1 tablet (4 mg total) by mouth daily at 6 PM. What changed:  medication strength  how much to take  when to take this       Follow Up Appointments: Follow-up Information    KRASOWSKI,ROBERT, MD Follow up on 08/22/2016.   Specialty:  Cardiology Why:  Appointment time is at 11:15 am Contact information: [place] [address] [phone]        Rexene Alberts, MD Follow up on 08/15/2016.   Specialty:  Cardiothoracic Surgery Why:  PA/LAT CXR to be taken (at [facility] which is in the same office as Dr. Guy Sandifer office) on 08/15/2016 at 2:00 pm;Appointment time is at 2:30 pm Contact information: [address] [place] [address] [phone]        Jenne Campus, MD Follow up.   Specialty:  Cardiology Why:  Call to have a PT and INR drawn on 03/**/2018 Contact information: [place] [address] [phone]            Signed: Cinda Quest 08/04/2016, 9:19 AM

## 2016-08-03 DIAGNOSIS — N390 Urinary tract infection, site not specified: Secondary | ICD-10-CM | POA: Insufficient documentation

## 2016-08-03 LAB — TYPE AND SCREEN
ABO/RH(D): O POS
Antibody Screen: POSITIVE
DAT, IGG: NEGATIVE
Donor AG Type: NEGATIVE
Donor AG Type: NEGATIVE
Donor AG Type: NEGATIVE
Donor AG Type: NEGATIVE
PT AG Type: NEGATIVE
UNIT DIVISION: 0
UNIT DIVISION: 0
Unit division: 0
Unit division: 0

## 2016-08-03 LAB — BPAM RBC
BLOOD PRODUCT EXPIRATION DATE: 201804032359
Blood Product Expiration Date: 201804032359
Blood Product Expiration Date: 201804032359
Blood Product Expiration Date: 201804032359
ISSUE DATE / TIME: 201803030553
ISSUE DATE / TIME: 201803030841
UNIT TYPE AND RH: 5100
Unit Type and Rh: 5100
Unit Type and Rh: 5100
Unit Type and Rh: 5100

## 2016-08-03 LAB — URINALYSIS, ROUTINE W REFLEX MICROSCOPIC
Bilirubin Urine: NEGATIVE
GLUCOSE, UA: NEGATIVE mg/dL
Ketones, ur: NEGATIVE mg/dL
NITRITE: NEGATIVE
PROTEIN: NEGATIVE mg/dL
Specific Gravity, Urine: 1.006 (ref 1.005–1.030)
Squamous Epithelial / LPF: NONE SEEN
pH: 5 (ref 5.0–8.0)

## 2016-08-03 LAB — BASIC METABOLIC PANEL
Anion gap: 8 (ref 5–15)
BUN: 22 mg/dL — AB (ref 6–20)
CO2: 29 mmol/L (ref 22–32)
Calcium: 9.4 mg/dL (ref 8.9–10.3)
Chloride: 102 mmol/L (ref 101–111)
Creatinine, Ser: 0.74 mg/dL (ref 0.44–1.00)
GFR calc Af Amer: >60 mL/min (ref >60)
GLUCOSE: 88 mg/dL (ref 65–99)
Potassium: 3.9 mmol/L (ref 3.5–5.1)
Sodium: 139 mmol/L (ref 135–145)

## 2016-08-03 LAB — PROTIME-INR
INR: 1.3
PROTHROMBIN TIME: 16.2 s — AB (ref 11.4–15.2)

## 2016-08-03 LAB — TSH: TSH: 15.574 u[IU]/mL — ABNORMAL HIGH (ref 0.350–4.500)

## 2016-08-03 LAB — MAGNESIUM: Magnesium: 1.5 mg/dL — ABNORMAL LOW (ref 1.7–2.4)

## 2016-08-03 MED ORDER — LEVOTHYROXINE SODIUM 75 MCG PO TABS
150.0000 ug | ORAL_TABLET | Freq: Every day | ORAL | Status: DC
  Administered 2016-08-03: 150 ug via ORAL
  Filled 2016-08-03: qty 2

## 2016-08-03 MED ORDER — AMIODARONE HCL 200 MG PO TABS
400.0000 mg | ORAL_TABLET | Freq: Two times a day (BID) | ORAL | Status: DC
  Administered 2016-08-03 (×2): 400 mg via ORAL
  Filled 2016-08-03 (×2): qty 2

## 2016-08-03 MED ORDER — METOPROLOL TARTRATE 12.5 MG HALF TABLET
12.5000 mg | ORAL_TABLET | Freq: Two times a day (BID) | ORAL | Status: DC
  Filled 2016-08-03: qty 1

## 2016-08-03 MED ORDER — POTASSIUM CHLORIDE CRYS ER 20 MEQ PO TBCR
40.0000 meq | EXTENDED_RELEASE_TABLET | Freq: Once | ORAL | Status: AC
  Administered 2016-08-03: 40 meq via ORAL
  Filled 2016-08-03: qty 2

## 2016-08-03 MED ORDER — MAGNESIUM SULFATE 2 GM/50ML IV SOLN
2.0000 g | Freq: Once | INTRAVENOUS | Status: AC
  Administered 2016-08-03: 2 g via INTRAVENOUS
  Filled 2016-08-03: qty 50

## 2016-08-03 MED ORDER — AMIODARONE IV BOLUS ONLY 150 MG/100ML
150.0000 mg | Freq: Once | INTRAVENOUS | Status: AC
  Administered 2016-08-03: 150 mg via INTRAVENOUS
  Filled 2016-08-03: qty 100

## 2016-08-03 MED ORDER — WARFARIN SODIUM 4 MG PO TABS
4.0000 mg | ORAL_TABLET | Freq: Every day | ORAL | Status: DC
  Administered 2016-08-03: 4 mg via ORAL
  Filled 2016-08-03: qty 1

## 2016-08-03 NOTE — Progress Notes (Addendum)
      [address]       [place] [id number]             [phone]      6 Days Post-Op Procedure(s) (LRB): MINIMALLY INVASIVE MITRAL VALVE REPAIR (MVR) using a 30 Sorin Memo 3D Mitral Valve Ring (Right) TRANSESOPHAGEAL ECHOCARDIOGRAM (TEE) (N/A)   Subjective:  No specific complaints.  Really wants a shower especially if she has to stay another day.   Objective: Vital signs in last 24 hours: Temp:  [98 F (36.7 C)-98.8 F (37.1 C)] 98 F (36.7 C) (03/07 0626) Pulse Rate:  [65-69] 65 (03/07 0626) Cardiac Rhythm: Sinus tachycardia (03/07 0700) Resp:  [17-18] 18 (03/07 0626) BP: (97-98)/(51-55) 98/55 (03/07 0626) SpO2:  [96 %-98 %] 97 % (03/07 0626) Weight:  [102 lb (46.3 kg)] 102 lb (46.3 kg) (03/07 0626)  Intake/Output from previous day: 03/06 0701 - 03/07 0700 In: 1110 [P.O.:1110] Out: -   General appearance: alert, cooperative and no distress Heart: irregularly irregular rhythm Lungs: clear to auscultation bilaterally Abdomen: soft, non-tender; bowel sounds normal; no masses,  no organomegaly Extremities: edema none present Wound: clean and dry  Lab Results:  Recent Labs  08/01/16 0253  WBC 10.5  HGB 11.7*  HCT 34.4*  PLT 136*   BMET:  Recent Labs  08/01/16 0253 08/03/16 0347  NA 141 139  K 3.6 3.9  CL 105 102  CO2 30 29  GLUCOSE 122* 88  BUN 11 22*  CREATININE 0.73 0.74  CALCIUM 8.4* 9.4    PT/INR:  Recent Labs  08/03/16 0347  LABPROT 16.2*  INR 1.30   ABG    Component Value Date/Time   PHART 7.368 07/29/2016 0313   HCO3 23.0 07/29/2016 0313   TCO2 24 07/29/2016 1632   ACIDBASEDEF 1.5 07/29/2016 0313   O2SAT 98.1 07/29/2016 0313   CBG (last 3)   Recent Labs  08/01/16 1245 08/01/16 1607 08/02/16 0644  GLUCAP 105* 102* 106*    Assessment/Plan: S/P Procedure(s) (LRB): MINIMALLY INVASIVE MITRAL VALVE REPAIR (MVR) using a 30 Sorin Memo 3D Mitral Valve Ring (Right) TRANSESOPHAGEAL ECHOCARDIOGRAM (TEE)  (N/A)  1. CV- continues to have runs of A. Fib, HR remains elevated in the 120s, BP too labile for BB... Increase Amiodarone to 400 mg BID 2. INR- has been sporatic, now down to 1.30, will give 4 mg of coumadin tonight 3. Pulm- no acute issues, continue IS 4. Renal- creatinine stable, weight is mildly above baseline, on Lasix, Potassium 5. Hypothyroidism- could be attributing to rate issues, will get TSH on Synthroid at 75 mcg 6. Dispo- patients rhythm remains variable, will increase Amiodarone to obtain better rate control, unfortunately her BP is too low for BB,  INR remains sporatic, adjusted dose as INR is dropping, continue Lasix... Patient not quite ready for d/c today   LOS: 6 days    BARRETT, ERIN 08/03/2016   I have seen and examined the patient and agree with the assessment and plan as outlined.  Patient went into Aflutter this morning - currently HR 120.  Hold discharge.  Will give extra IV bolus amiodarone and increase dose 400 bid.  Add low dose metoprolol.  Patient may be off telemetry briefly for shower as long as someone is with her.  TSH elevated - will increase Synthroid dose to 150 mg/day.  Purcell Nailslarence H Jinnifer Montejano, MD 08/03/2016 9:06 AM

## 2016-08-03 NOTE — Progress Notes (Signed)
CARDIAC REHAB PHASE I  First visit   [phone] PRE:  Rate/Rhythm: 120 afib  BP:  Supine:   Sitting: 113/85  Standing:    SaO2: 99%RA  MODE:  Ambulation: ft To bathroom and back to bed  Came this morning  to see if pt wanted to walk. Vitals as above. RN to give amio bolus so I assisted pt to bathroom and then back to bed. Told pt would follow up later.  Second visit  [phone] Offered to walk. Pt stated she has not felt well today with meds received and  heart rate converting to SB in 50s and she just wants to rest now. Encouraged her to use IS and to walk with staff later when she feels better.     Luetta Nuttingharlene Mee Macdonnell, RN BSN  08/03/2016 1:28 PM

## 2016-08-03 NOTE — Progress Notes (Signed)
Pt complained of burning at IV site. Line flushes well, no redness/swelling or s/s of infiltration. Decreased rate to 35 cc/hr for improved comfort. Pt states "feels much better"  Leonidas Rombergaitlin S Bumbledare, RN

## 2016-08-03 NOTE — Progress Notes (Signed)
Just minutes after taking night amiodarone, when getting up for the bathroom, pt's heart rate went up to 130s and sounded irregular when auscultated. Was asymptomatic and did not feel palpitations. For about an hour the heart rhythm would go from sinus tach at 126 bpm to sinus rhythm in the 70s and back.  Pt is now in sinus rhythm at rate of 77. Will continue to monitor pt.  Harriet Massonavidson, Kelena Garrow E, RN

## 2016-08-04 LAB — PROTIME-INR
INR: 1.23
Prothrombin Time: 15.6 seconds — ABNORMAL HIGH (ref 11.4–15.2)

## 2016-08-04 MED ORDER — CIPROFLOXACIN HCL 500 MG PO TABS: 500.0000 mg | ORAL_TABLET | Freq: Two times a day (BID) | ORAL | Status: DC

## 2016-08-04 MED ORDER — NITROFURANTOIN MONOHYD MACRO 100 MG PO CAPS
100.0000 mg | ORAL_CAPSULE | Freq: Two times a day (BID) | ORAL | Status: DC
  Administered 2016-08-04: 100 mg via ORAL
  Filled 2016-08-04: qty 1

## 2016-08-04 MED ORDER — ASPIRIN 81 MG PO TBEC: 81.0000 mg | DELAYED_RELEASE_TABLET | Freq: Every day | ORAL | Status: DC

## 2016-08-04 MED ORDER — WARFARIN SODIUM 4 MG PO TABS: 3.0000 mg | ORAL_TABLET | Freq: Every day | ORAL | 3 refills | Status: AC

## 2016-08-04 MED ORDER — AMIODARONE HCL 200 MG PO TABS
200.0000 mg | ORAL_TABLET | Freq: Two times a day (BID) | ORAL | Status: DC
  Administered 2016-08-04: 200 mg via ORAL
  Filled 2016-08-04: qty 1

## 2016-08-04 MED ORDER — WARFARIN SODIUM 5 MG PO TABS
5.0000 mg | ORAL_TABLET | Freq: Every day | ORAL | Status: DC
Start: 2016-08-04 — End: 2016-08-04

## 2016-08-04 MED ORDER — LEVOTHYROXINE SODIUM 150 MCG PO TABS: 150.0000 ug | ORAL_TABLET | Freq: Every day | ORAL | 1 refills | Status: AC

## 2016-08-04 MED ORDER — TRAMADOL HCL 50 MG PO TABS: 50.0000 mg | ORAL_TABLET | ORAL | 0 refills | Status: DC | PRN

## 2016-08-04 MED ORDER — NITROFURANTOIN MONOHYD MACRO 100 MG PO CAPS: 100.0000 mg | ORAL_CAPSULE | Freq: Two times a day (BID) | ORAL | 0 refills | Status: DC

## 2016-08-04 MED ORDER — AMIODARONE HCL 200 MG PO TABS: 200.0000 mg | ORAL_TABLET | Freq: Two times a day (BID) | ORAL | 1 refills | Status: DC

## 2016-08-04 MED ORDER — AMIODARONE HCL 200 MG PO TABS
400.0000 mg | ORAL_TABLET | Freq: Two times a day (BID) | ORAL | 0 refills | Status: DC
Start: 2016-08-04 — End: 2016-08-04

## 2016-08-04 MED FILL — Heparin Sodium (Porcine) Inj 1000 Unit/ML: INTRAMUSCULAR | Qty: 2500 | Status: AC

## 2016-08-04 NOTE — Progress Notes (Addendum)
      [address]       [place] [id number]             [phone]      7 Days Post-Op Procedure(s) (LRB): MINIMALLY INVASIVE MITRAL VALVE REPAIR (MVR) using a 30 Sorin Memo 3D Mitral Valve Ring (Right) TRANSESOPHAGEAL ECHOCARDIOGRAM (TEE) (N/A)   Subjective:  No new complaints.  Feeling much better wants to go home.  She states she takes Macrobid for her UTIs. Complains of mouth being dry.   Objective: Vital signs in last 24 hours: Temp:  [97.5 F (36.4 C)-98.4 F (36.9 C)] 97.5 F (36.4 C) (03/08 0418) Pulse Rate:  [51-65] 59 (03/08 0418) Cardiac Rhythm: Sinus bradycardia (03/08 0700) Resp:  [17-18] 18 (03/08 0418) BP: (91-115)/(47-82) 102/48 (03/08 0418) SpO2:  [97 %-100 %] 97 % (03/08 0418) Weight:  [103 lb 8 oz (46.9 kg)] 103 lb 8 oz (46.9 kg) (03/08 0418)  Intake/Output from previous day: 03/07 0701 - 03/08 0700 In: 1090 [P.O.:1090] Out: -   General appearance: alert, cooperative and no distress Heart: regular rate and rhythm Lungs: clear to auscultation bilaterally Abdomen: soft, non-tender; bowel sounds normal; no masses,  no organomegaly Extremities: edema none  Wound: clean and dry, ecchymosis present  Lab Results: No results for input(s): WBC, HGB, HCT, PLT in the last 72 hours. BMET:  Recent Labs  08/03/16 0347  NA 139  K 3.9  CL 102  CO2 29  GLUCOSE 88  BUN 22*  CREATININE 0.74  CALCIUM 9.4    PT/INR:  Recent Labs  08/04/16 0206  LABPROT 15.6*  INR 1.23   ABG    Component Value Date/Time   PHART 7.368 07/29/2016 0313   HCO3 23.0 07/29/2016 0313   TCO2 24 07/29/2016 1632   ACIDBASEDEF 1.5 07/29/2016 0313   O2SAT 98.1 07/29/2016 0313   CBG (last 3)   Recent Labs  08/01/16 1245 08/01/16 1607 08/02/16 0644  GLUCAP 105* 102* 106*    Assessment/Plan: S/P Procedure(s) (LRB): MINIMALLY INVASIVE MITRAL VALVE REPAIR (MVR) using a 30 Sorin Memo 3D Mitral Valve Ring (Right) TRANSESOPHAGEAL ECHOCARDIOGRAM (TEE)  (N/A)  1. CV- PAF, Flutter, Currently Sinus Brady- rate in the 50s, BP remains labile.... On Amiodarone 400 mg BID, Lopressor was ordered, but nursing held both doses due to BP 2. INR 1.23, will put patient on Coumadin at 5 mg which was home dose... Will need PT/INR checked Monday 3. Pulm- no acute issues, continue IS 4. Renal- creatinine, weight is stable, no lasix indicated 5. Hypothyroidism- TSH was >15.Marland Kitchen. Synthroid increased to 150 mcg 6. Dispo- patient stable, maintaining NSR, will continue coumadin, amiodarone, BB not administered will not prescribe at discharge, plan to d/c home today if ok with Dr. Cornelius Moraswen   LOS: 7 days    BARRETT, Denny PeonRIN 08/04/2016  I have seen and examined the patient and agree with the assessment and plan as outlined.  Decrease amiodarone to 200 mg BID and stop metoprolol.  D/C home today on Coumadin 4 mg/day.  Instructions given.  Check INR on Monday at primary care physician's office.  Purcell Nailslarence H Owen, MD 08/04/2016 8:48 AM

## 2016-08-04 NOTE — Progress Notes (Addendum)
Cardiac Rehab 1020-1100 Completed discharge education with pt. Gave her exercise guidelines, heart healthy diet and discussed Outpt. CRP. She voices understanding.We discussed restrictions and I encouraged use of IS. I will send referral to Outpt. CRP where pt wants to go. She wants to discuss with her son because she has not lived in [place] long [facility] [employer]. I will check back when he arrives to get her decision. I instructed her to watch going home after heart surgery video.

## 2016-08-04 NOTE — Progress Notes (Signed)
Discharged per MD order. No IV upon my shift arrival. Telemetry d/c, CCMD notified. AVS completed with patient including follow up appointments and medications. Answered all of patients questions about her continued medications and what she will need to take tonight. Waiting on sons arrival. No further questions at this time. Patient to home via car.  [name] RN

## 2016-08-05 ENCOUNTER — Telehealth: Payer: Self-pay | Admitting: Thoracic Surgery (Cardiothoracic Vascular Surgery)

## 2016-08-05 DIAGNOSIS — N390 Urinary tract infection, site not specified: Secondary | ICD-10-CM

## 2016-08-05 LAB — URINE CULTURE
Culture: >=100000 — AB
Special Requests: NORMAL

## 2016-08-05 MED ORDER — CIPROFLOXACIN HCL 500 MG PO TABS: 500.0000 mg | ORAL_TABLET | Freq: Two times a day (BID) | ORAL | 0 refills | Status: AC

## 2016-08-05 NOTE — Telephone Encounter (Signed)
Called patient because results of urine culture notable for > 100,000 col/mL Klebsiella pneumonia that was not sensitive to nitrofurantoin.  We discussed a short course of Ciprofloxacin as an alternative.  Because of the short duration there should be minimal affects related to concurrent administration of warfarin and amiodarone.  Purcell Nailslarence H Emiliana Blaize, MD 08/05/2016 3:11 PM

## 2016-08-12 ENCOUNTER — Other Ambulatory Visit: Payer: Self-pay | Admitting: Thoracic Surgery (Cardiothoracic Vascular Surgery)

## 2016-08-12 DIAGNOSIS — Z8249 Family history of ischemic heart disease and other diseases of the circulatory system: Secondary | ICD-10-CM

## 2016-08-15 ENCOUNTER — Ambulatory Visit
Admission: RE | Admit: 2016-08-15 | Discharge: 2016-08-15 | Disposition: A | Discharge status: Home / Self Care | Payer: Medicare Other | Source: Ambulatory Visit | Attending: Thoracic Surgery (Cardiothoracic Vascular Surgery) | Admitting: Thoracic Surgery (Cardiothoracic Vascular Surgery)

## 2016-08-15 ENCOUNTER — Ambulatory Visit (INDEPENDENT_AMBULATORY_CARE_PROVIDER_SITE_OTHER): Payer: Self-pay | Admitting: Physician Assistant

## 2016-08-15 VITALS — BP 135/78 | HR 72 | Resp 20 | Ht 63.0 in | Wt 104.0 lb

## 2016-08-15 DIAGNOSIS — I340 Nonrheumatic mitral (valve) insufficiency: Secondary | ICD-10-CM

## 2016-08-15 DIAGNOSIS — Z8249 Family history of ischemic heart disease and other diseases of the circulatory system: Secondary | ICD-10-CM

## 2016-08-15 DIAGNOSIS — Z9889 Other specified postprocedural states: Secondary | ICD-10-CM

## 2016-08-15 NOTE — Progress Notes (Signed)
HPI:  Patient returns for routine postoperative follow-up having undergone MI MV Repair on 07/28/2016.  The patient's early postoperative recovery while in the hospital was notable for Junctional Bradycardia and Junctional rhythm.  She had some brief episodes of Atrial Flutter which responded with IV Amiodarone.  Finally she was treated for a UTI. Since hospital discharge the patient reports she is doing very well.  She continues to have some incisional discomfort and numbness around her incision.  She also continues to have some episodes of shortness of breath.  Finally she occasionally has episodes where she feels like her heart is beating fast.  She is ambulating without difficulty.  She has not taken the dressing off her incision since hospital discharge.  She has been showering daily.  Her INR has been well controlled.   Current Outpatient Prescriptions  Medication Sig Dispense Refill  . ALPRAZolam (XANAX) 0.5 MG tablet Take 0.25 mg by mouth at bedtime as needed for anxiety.    Marland Kitchen amiodarone (PACERONE) 200 MG tablet Take 1 tablet (200 mg total) by mouth 2 (two) times daily. 60 tablet 1  . aspirin EC 81 MG EC tablet Take 1 tablet (81 mg total) by mouth daily.    . calcium carbonate (OSCAL) 1500 (600 Ca) MG TABS tablet Take 1,500 mg by mouth 2 (two) times daily with a meal.    . ibandronate (BONIVA) 150 MG tablet Take 150 mg by mouth every 30 (thirty) days.     Marland Kitchen levothyroxine (SYNTHROID, LEVOTHROID) 150 MCG tablet Take 1 tablet (150 mcg total) by mouth daily at 10 pm. 30 tablet 1  . MAGNESIUM-ZINC PO Take 1 tablet by mouth daily.    . MetroNIDAZOLE-Cleanser 0.75 % (Cream) KIT Apply 1 application topically at bedtime.    . Multiple Vitamin (MULTIVITAMIN WITH MINERALS) TABS tablet Take 1 tablet by mouth daily.    . Omega-3 Fatty Acids (FISH OIL) 1200 MG CAPS Take 2,400 mg by mouth daily.    . pravastatin (PRAVACHOL) 20 MG tablet Take 20 mg by mouth every evening.     . traMADol (ULTRAM) 50 MG  tablet Take 1-2 tablets (50-100 mg total) by mouth every 4 (four) hours as needed for moderate pain. 30 tablet 0  . vitamin C (ASCORBIC ACID) 500 MG tablet Take 500 mg by mouth 2 (two) times daily.    Marland Kitchen warfarin (COUMADIN) 4 MG tablet Take 1 tablet (4 mg total) by mouth daily at 6 PM. 30 tablet 3   No current facility-administered medications for this visit.     Physical Exam:  BP 135/78   Pulse 72   Resp 20   Ht '5\' 3"'$  (1.6 m)   Wt 104 lb (47.2 kg)   SpO2 98% Comment: RA  BMI 18.42 kg/m   Gen: no apparent distress Heart; RRR Lungs: CTA Abd: soft, non-tender, non-distended Ext: no edema present Incisions: well healed, chest tube sutures in place  Diagnostic Tests:  CXR: some right sided atelectasis, no pleural effusions present  A/P:  1. S/P Mini MV Repair- doing well, continue Amiodarone, BP remains well controlled, HR in the 70s- no indication for BB, ACE at this time 2. A. Flutter- currently in NSR, patient states she has some episodes of fast heart rate.. Instructed patient that should she have an episode that does not resolve to contact our or Cardiology office. 3. INR- continue coumadin, well controlled 4.  Incisions-healing well, removed chest tube sutures... Instructed patient to continue good wound care 5. RTC in  June with Dr. Geraldo Docker, PA-C [facility] [phone]

## 2016-08-15 NOTE — Patient Instructions (Signed)
You may return to driving an automobile as long as you are no longer requiring oral narcotic pain relievers during the daytime.  It would be wise to start driving only short distances during the daylight and gradually increase from there as you feel comfortable.   You may gradually increase your physical activity as tolerated without any particular limitations at this time, except weight limitations of 10-15lbs  Endocarditis is a potentially serious infection of heart valves or inside lining of the heart.  It occurs more commonly in patients with diseased heart valves (such as patient's with aortic or mitral valve disease) and in patients who have undergone heart valve repair or replacement.  Certain surgical and dental procedures may put you at risk, such as dental cleaning, other dental procedures, or any surgery involving the respiratory, urinary, gastrointestinal tract, gallbladder or prostate gland.   To minimize your chances for develooping endocarditis, maintain good oral health and seek prompt medical attention for any infections involving the mouth, teeth, gums, skin or urinary tract.    Always notify your doctor or dentist about your underlying heart valve condition before having any invasive procedures. You will need to take antibiotics before certain procedures, including all routine dental cleanings or other dental procedures.  Your cardiologist or dentist should prescribe these antibiotics for you to be taken ahead of time.

## 2016-08-17 ENCOUNTER — Other Ambulatory Visit: Payer: Self-pay | Admitting: Thoracic Surgery (Cardiothoracic Vascular Surgery)

## 2016-10-14 ENCOUNTER — Telehealth: Payer: Self-pay

## 2016-10-14 NOTE — Telephone Encounter (Signed)
Celene SkeenJanet Stopher was seen by PA on 08/15/16 for post operative exam. She is scheduled to see Dr Cornelius Moraswen on 10/31/16. And is also being followed by her Cardiologist Dr Bing MatterKrasowski. She has not been readmitted to hospital.

## 2016-10-31 ENCOUNTER — Encounter: Payer: Self-pay | Admitting: Thoracic Surgery (Cardiothoracic Vascular Surgery)

## 2016-10-31 ENCOUNTER — Ambulatory Visit (INDEPENDENT_AMBULATORY_CARE_PROVIDER_SITE_OTHER): Payer: Federal, State, Local not specified - PPO | Admitting: Thoracic Surgery (Cardiothoracic Vascular Surgery)

## 2016-10-31 VITALS — BP 132/73 | HR 67 | Resp 16 | Ht 63.0 in | Wt 106.8 lb

## 2016-10-31 DIAGNOSIS — Z9889 Other specified postprocedural states: Secondary | ICD-10-CM

## 2016-10-31 MED ORDER — AMIODARONE HCL 200 MG PO TABS: 200.0000 mg | ORAL_TABLET | Freq: Every day | ORAL | 1 refills | Status: DC

## 2016-10-31 NOTE — Progress Notes (Signed)
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  The patient's early postoperative recovery in the hospital was notable for brief episode of paroxysmal atrial fibrillation and atrial flutter for which she was treated with amiodarone. She was discharged from the hospital in sinus rhythm on the fifth postoperative day. Since hospital discharge she was last seen here in our office on 08/15/2016 at which time she was doing well. Since then she has been seen by her primary cardiologist, Dr. Idelia Salm 09/15/2016. Her dose of amiodarone was decreased to 200 mg daily at that time. She had a follow-up echocardiogram performed recently that she states reportedly looked good.  She returns to our office today for routine follow-up. She reports normal physical activity with no symptoms of exertional shortness of breath. She states that prior to surgery she frequently fell associated to try to take a deep breath, and this has improved. She no longer has any significant pain in her chest. She does have some numbness along her surgical incision. She states that at times she feels her heart "pounding" but it does not feel irregular.  She thinks that her resting pulse may be faster than it was in the past. He continues to have her Coumadin dose adjusted through her primary care physician's office. Overall she is pleased with her progress.  Current Outpatient Prescriptions    Medication Sig Dispense Refill  . ALPRAZolam (XANAX) 0.5 MG tablet Take 0.25 mg by mouth at bedtime as needed for anxiety.    Marland Kitchen amiodarone (PACERONE) 200 MG tablet Take 1 tablet (200 mg total) by mouth 2 (two) times daily. 60 tablet 1  . aspirin EC 81 MG EC tablet Take 1 tablet (81 mg total) by mouth daily.    . calcium carbonate (OSCAL) 1500 (600 Ca) MG TABS tablet Take 1,500 mg by mouth 2 (two) times daily with a meal.    . ibandronate (BONIVA) 150 MG tablet Take 150 mg by mouth every 30 (thirty) days.     Marland Kitchen levothyroxine (SYNTHROID, LEVOTHROID) 150 MCG tablet Take 1 tablet (150 mcg total) by mouth daily at 10 pm. 30 tablet 1  . MAGNESIUM-ZINC PO Take 1 tablet by mouth daily.    . MetroNIDAZOLE-Cleanser 0.75 % (Cream) KIT Apply 1 application topically at bedtime.    . Multiple Vitamin (MULTIVITAMIN WITH MINERALS) TABS tablet Take 1 tablet by mouth daily.    . Omega-3 Fatty Acids (FISH OIL) 1200 MG CAPS Take 2,400 mg by mouth daily.    . pravastatin (PRAVACHOL) 20 MG tablet Take 20 mg by mouth every evening.     . vitamin C (ASCORBIC ACID) 500 MG tablet Take 500 mg by mouth 2 (two) times daily.    Marland Kitchen warfarin (COUMADIN) 4 MG tablet Take 1 tablet (4 mg total) by mouth daily at 6 PM. 30 tablet 3   No current facility-administered medications for this visit.  Physical Exam:   BP 132/73 (BP Location: Right Arm, Patient Position: Sitting, Cuff Size: Normal)   Pulse 67   Resp 16   Ht _0  (1.6 m)   Wt 106 lb 12.8 oz (48.4 kg)   SpO2 97% Comment: RA  BMI 18.92 kg/m   General:  Well-appearing  Chest:   Clear to auscultation  CV:   Regular rate and rhythm without murmur  Incisions:  Completely healed  Abdomen:  Soft nontender  Extremities:  Warm and well-perfused  Diagnostic Tests:  n/a   Impression:  Patient is doing well approximately 3 months status post minimally invasive mitral valve repair. The patient had a follow-up echocardiogram performed 10/21/2016 at her  cardiologist's office. According to the patient the echo looked good. Official results are not currently available for review.    Plan:  I have encouraged the patient to continue to gradually increase her physical activity without any particular limitations at this time. We have not recommended any changes in her current medications. However, if the patient continues to remain in sinus rhythm I would recommend stopping amiodarone. The patient has been reminded to discuss this with her cardiologist and her primary care physician.  In addition, she no longer needs to be on aspirin now that she is therapeutic on warfarin.  The patient has been reminded regarding the importance of dental hygiene and the lifelong need for antibiotic prophylaxis for all dental cleanings and other related invasive procedures.  All of her questions have been addressed. The patient will return to our office for routine follow-up next March, approximately 1 year following her surgery.  During the interim period time she will call and return to see [place] only should specific problems or questions arise.  I spent in excess of 15 minutes during the conduct of this office consultation and >50% of this time involved direct face-to-face encounter with the patient for counseling and/or coordination of their care.    Valentina Gu. Roxy Manns, MD 10/31/2016 1:15 PM

## 2016-10-31 NOTE — Patient Instructions (Addendum)
You may resume unrestricted physical activity without any particular limitations at this time.  You may stop taking aspirin now that you are therapeutic on warfarin.  Continue all other previous medications without any changes at this time  I would favor stopping amiodarone in the near future as long as your heart remains in rhythm.  I would not refill your current prescription for amiodarone.  Notify your cardiologist and your primary care physician if you stop taking amiodarone as this may affect your warfarin dosing.  Endocarditis is a potentially serious infection of heart valves or inside lining of the heart.  It occurs more commonly in patients with diseased heart valves (such as patient's with aortic or mitral valve disease) and in patients who have undergone heart valve repair or replacement.  Certain surgical and dental procedures may put you at risk, such as dental cleaning, other dental procedures, or any surgery involving the respiratory, urinary, gastrointestinal tract, gallbladder or prostate gland.   To minimize your chances for develooping endocarditis, maintain good oral health and seek prompt medical attention for any infections involving the mouth, teeth, gums, skin or urinary tract.    Always notify your doctor or dentist about your underlying heart valve condition before having any invasive procedures. You will need to take antibiotics before certain procedures, including all routine dental cleanings or other dental procedures.  Your cardiologist or dentist should prescribe these antibiotics for you to be taken ahead of time.

## 2016-10-31 NOTE — Addendum Note (Signed)
Addended by: Purcell NailsWEN, CLARENCE H on: 10/31/2016 01:44 PM   Modules accepted: Orders

## 2017-07-31 ENCOUNTER — Ambulatory Visit: Payer: Medicare Other | Admitting: Thoracic Surgery (Cardiothoracic Vascular Surgery)

## 2017-08-21 ENCOUNTER — Other Ambulatory Visit: Payer: Self-pay

## 2017-08-21 ENCOUNTER — Encounter: Payer: Self-pay | Admitting: Thoracic Surgery (Cardiothoracic Vascular Surgery)

## 2017-08-21 ENCOUNTER — Ambulatory Visit: Payer: Federal, State, Local not specified - PPO | Admitting: Thoracic Surgery (Cardiothoracic Vascular Surgery)

## 2017-08-21 VITALS — BP 139/84 | HR 90 | Resp 16 | Ht 63.0 in | Wt 107.8 lb

## 2017-08-21 DIAGNOSIS — Z09 Encounter for follow-up examination after completed treatment for conditions other than malignant neoplasm: Secondary | ICD-10-CM | POA: Diagnosis not present

## 2017-08-21 DIAGNOSIS — Z9889 Other specified postprocedural states: Secondary | ICD-10-CM | POA: Diagnosis not present

## 2017-08-21 NOTE — Progress Notes (Signed)
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  Her postoperative recovery was uneventful and follow-up echocardiogram performed last spring at her cardiologist's office revealed normal left ventricular function and intact mitral valve repair.  She was last seen in our office on October 31, 2016 at which time she was doing well.  She returns to our office for routine follow-up today and reports that she continues to do well although she has been having occasional palpitations.  She was seen in follow-up recently at her cardiologist office and apparently found to be hyperthyroid.  Her dose of Synthroid was decreased.  She remains chronically anticoagulated using warfarin.  She is not currently on a beta-blocker.  Overall she feels well although she remains chronically anxious.  She denies any symptoms of exertional shortness of breath.  She still has occasional palpitations.  Current Outpatient Medications  Medication Sig Dispense Refill  . ALPRAZolam (XANAX) 0.5 MG tablet Take 0.25 mg by mouth at bedtime as needed for anxiety.    . calcium carbonate (OSCAL) 1500 (600 Ca) MG TABS tablet Take 1,500 mg by mouth 2 (two) times daily with a meal.    . levothyroxine (SYNTHROID, LEVOTHROID) 150 MCG tablet Take 1 tablet (150 mcg total) by mouth daily at 10 pm. (Patient taking differently: 125 mcg daily at 10 pm. ) 30 tablet 1  . MAGNESIUM-ZINC PO Take 1 tablet by  mouth daily.    . MetroNIDAZOLE-Cleanser 0.75 % (Cream) KIT Apply 1 application topically at bedtime.    . Multiple Vitamin (MULTIVITAMIN WITH MINERALS) TABS tablet Take 1 tablet by mouth daily.    . Omega-3 Fatty Acids (FISH OIL) 1200 MG CAPS Take 2,400 mg by mouth daily.    . pravastatin (PRAVACHOL) 20 MG tablet Take 20 mg by mouth every evening.     . vitamin C (ASCORBIC ACID) 500 MG tablet Take 500 mg by mouth 2 (two) times daily.    Marland Kitchen warfarin (COUMADIN) 4 MG tablet Take 1 tablet (4 mg total) by mouth daily at 6 PM. 30 tablet 3   No current facility-administered medications for this visit.       Physical Exam:   BP 139/84 (BP Location: Right Arm, Patient Position: Sitting, Cuff Size: Normal)   Pulse 90   Resp 16   Ht '5\' 3"'$  (1.6 m)   Wt 107 lb 12.8 oz (48.9 kg)   SpO2 98% Comment: RA  BMI 19.10 kg/m   General:  Well-appearing  Chest:   Clear to auscultation  CV:   Regular rate and rhythm without murmur  Incisions:  Completely healed  Abdomen:  Soft nontender  Extremities:  Warm and well perfused  Diagnostic Tests:  n/a   Impression:  Patient is doing well approximately 1 year following minimally invasive mitral valve repair.  She does not have a murmur on exam and follow-up echocardiogram performed last spring with good  with normal left ventricular function and intact mitral valve repair.  She has been having some intermittent palpitations.  She is chronically anticoagulated using warfarin.  Thyroid hormone replacement has been adjusted and the patient is being followed actively by her cardiologist.   Plan:  We have not recommended any changes to the patient's current medications.  The patient has been reminded regarding the importance of dental hygiene and the lifelong need for antibiotic prophylaxis for all dental cleanings and other related invasive procedures.  She will continue to follow-up with her regular cardiologist and return to our office in the future only  should specific problems or questions arise.  All questions answered.   I spent in excess of 15 minutes during the conduct of this office consultation and >50% of this time involved direct face-to-face encounter with the patient for counseling and/or coordination of their care.    Valentina Gu. Roxy Manns, MD 08/21/2017 10:45 AM

## 2017-08-21 NOTE — Patient Instructions (Addendum)

## 2018-08-14 IMAGING — DX DG CHEST 1V PORT
1 series · 1 of 1 positions shown · non-contrast
Comparison: 07/30/2016

CLINICAL DATA: Status post mitral valve repair.

EXAM:
PORTABLE CHEST 1 VIEW

[chest ap]
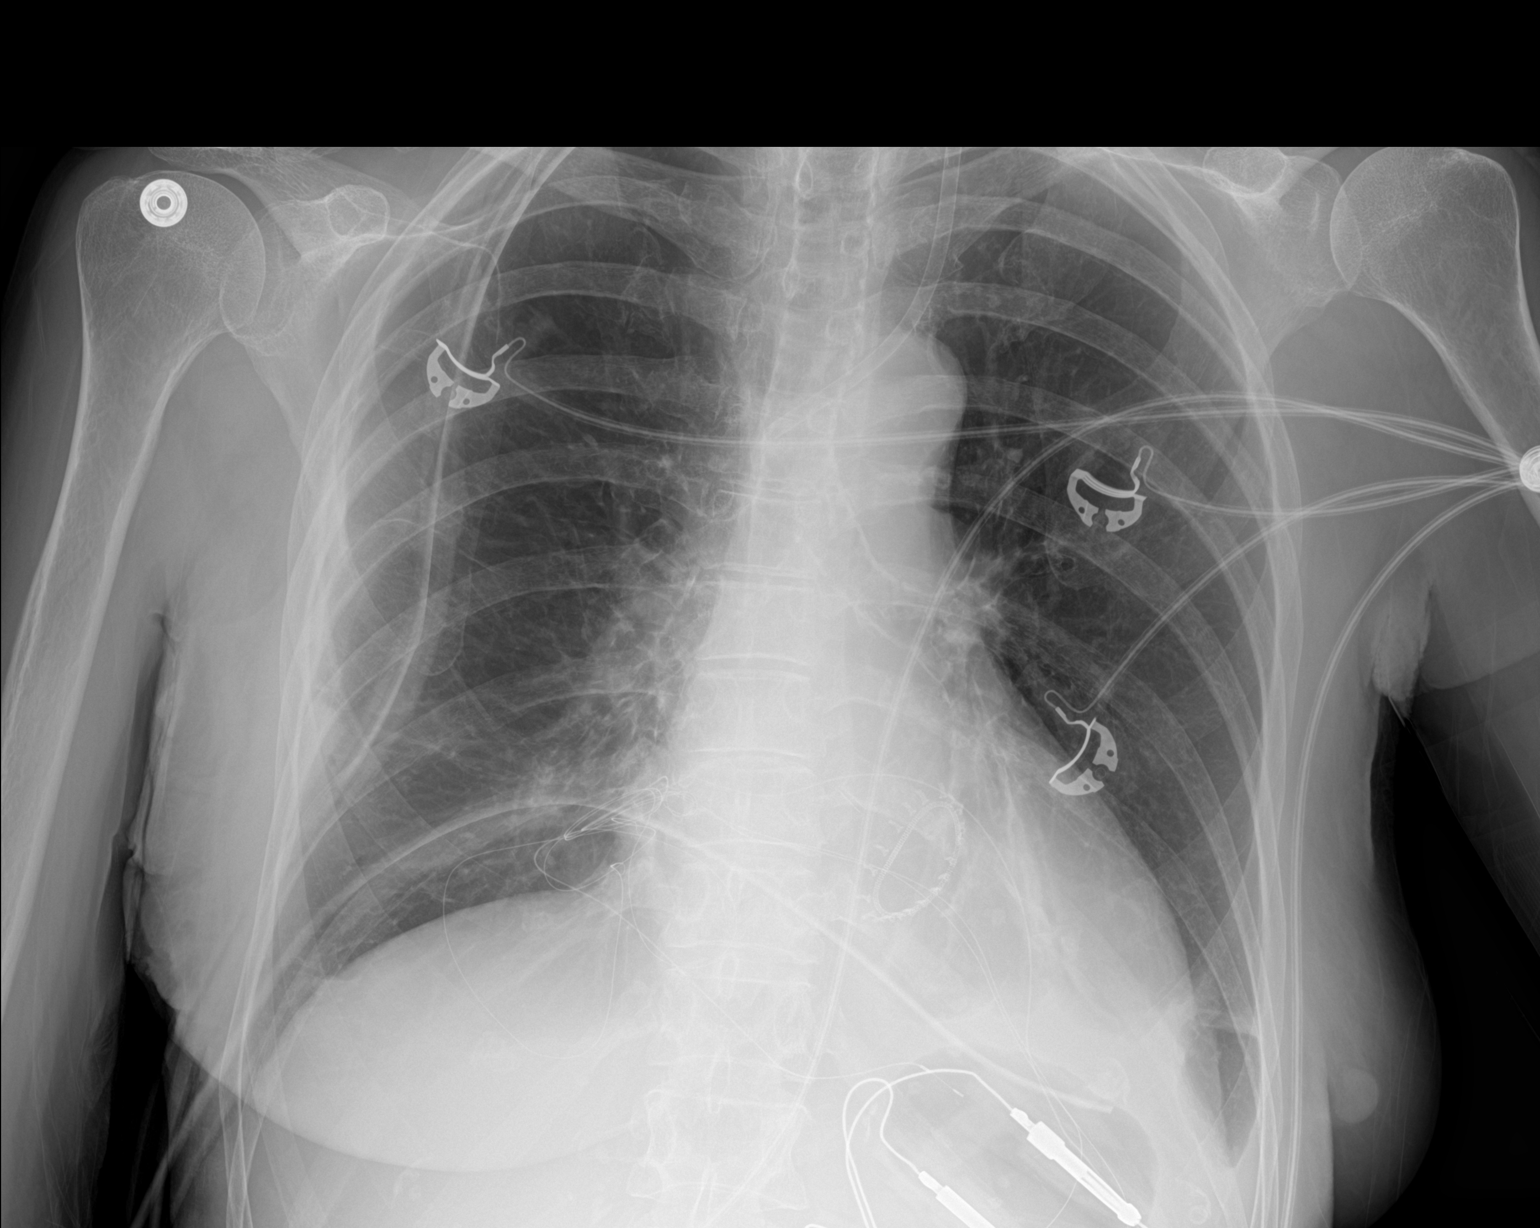

[1 of 1 positions shown; findings below may reference images not displayed]

FINDINGS: Sequelae of mitral valve repair are again identified. Left jugular
sheath is unchanged, as are a right-sided chest tube and a
mediastinal drain. The cardiac silhouette is normal in size. No
pneumothorax is identified. There is minimal right basilar opacity
which has slightly improved. There is streaky left basilar opacity
which demonstrates more substantial improvement. There may be a tiny
residual left pleural effusion.
IMPRESSION: Improved aeration of the lung bases with residual left greater than
right basilar atelectasis. No pneumothorax.

## 2018-08-15 IMAGING — DX DG CHEST 2V
2 series · 2 of 2 positions shown · non-contrast
Comparison: 07/31/2016

CLINICAL DATA: Status post mitral valve replacement.

EXAM:
CHEST  2 VIEW

[chest pa]
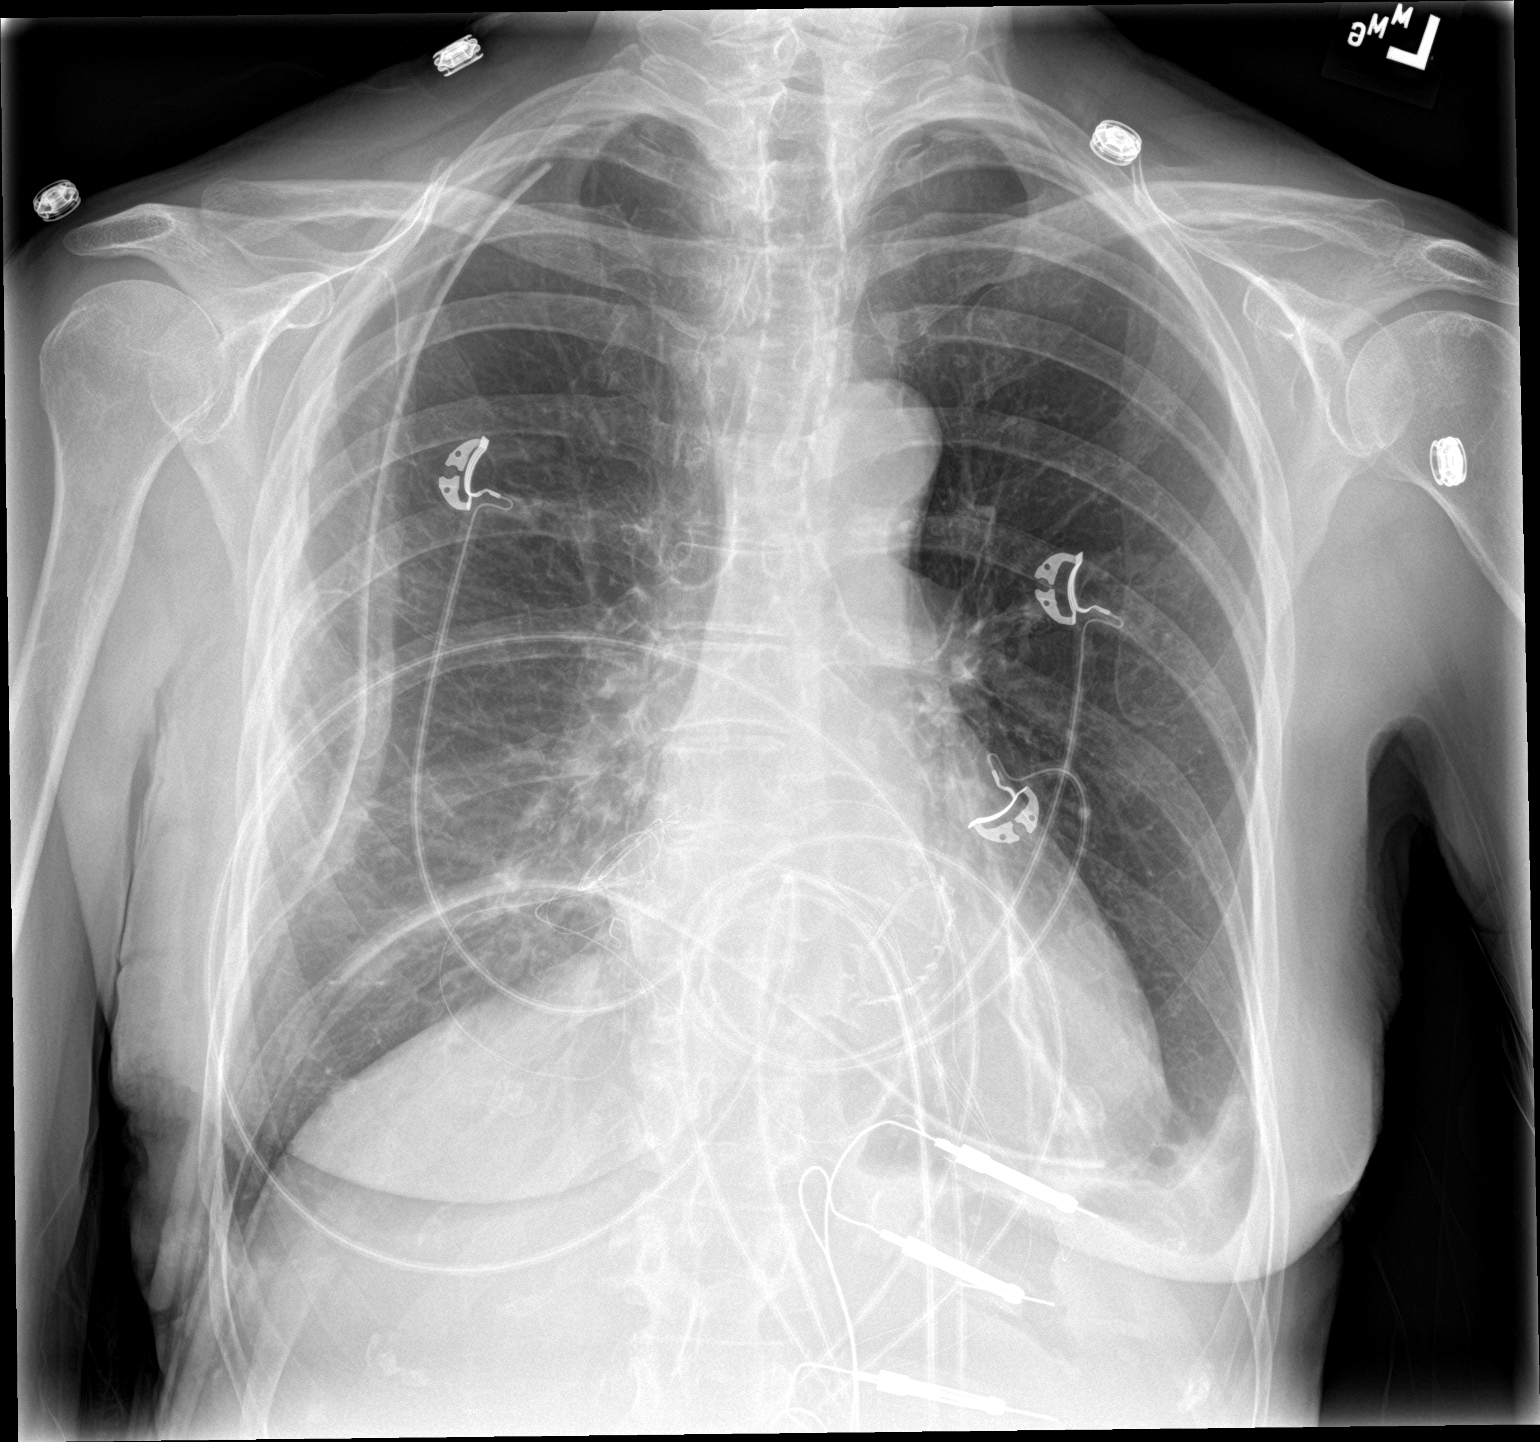

[chest lat]
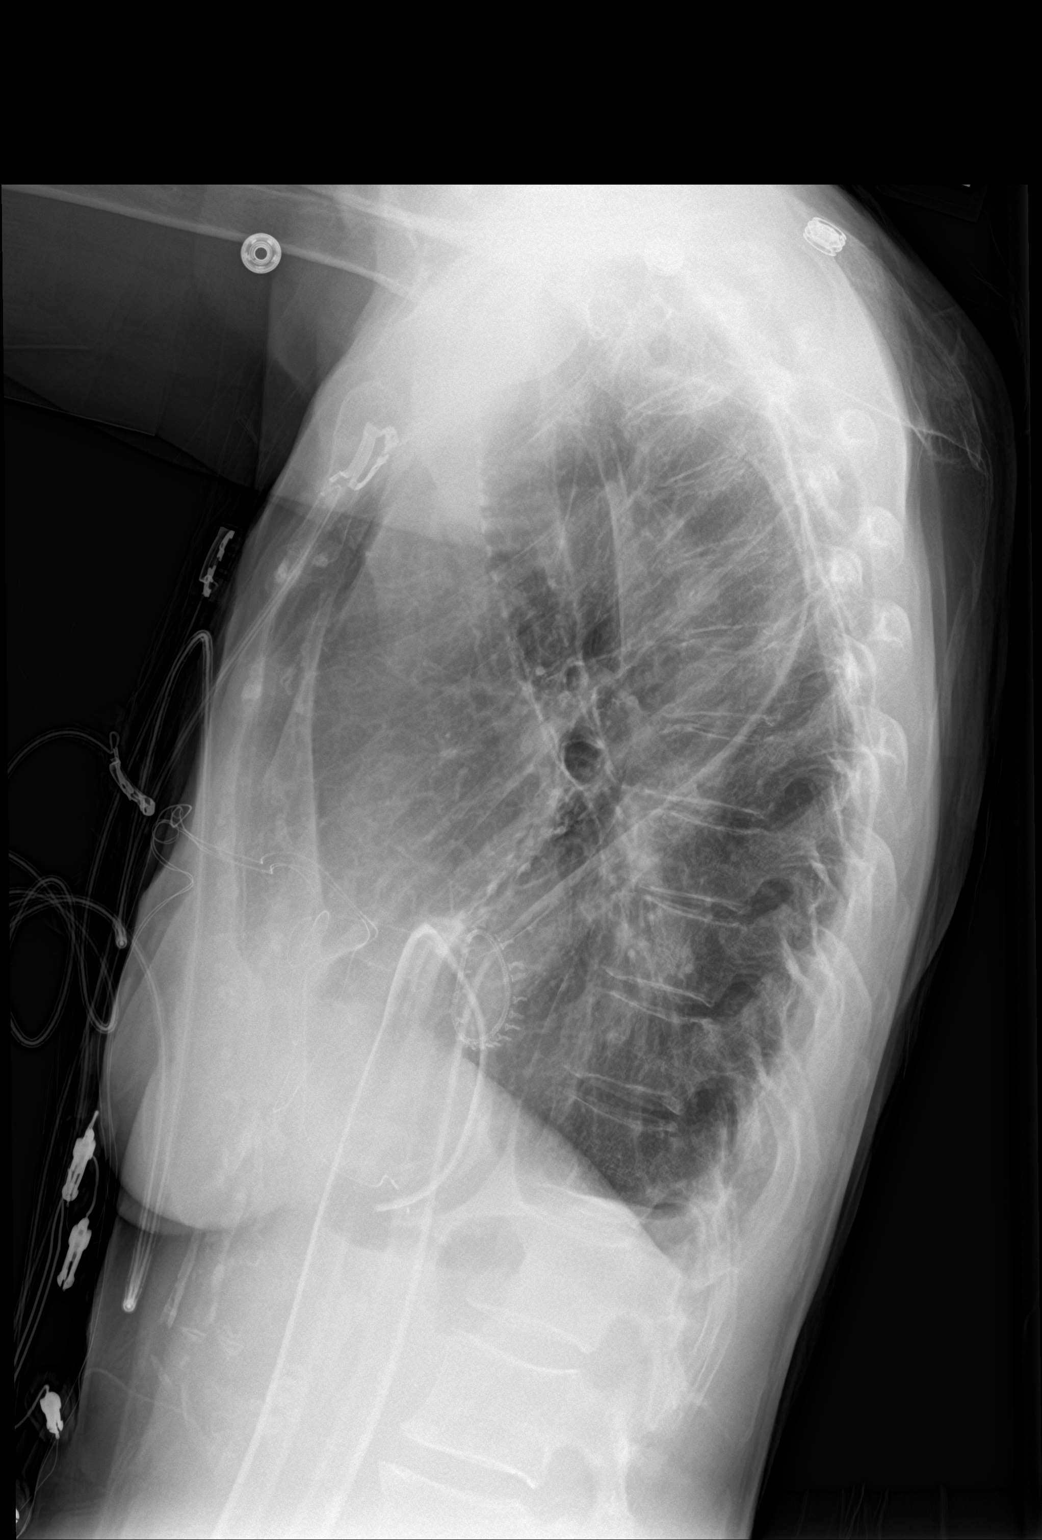

[2 of 2 positions shown; findings below may reference images not displayed]

FINDINGS: The right-sided chest tube is stable. No pneumothorax. There is a
mediastinal drain tube in place also. Epicardial pacer wires noted.
The heart is normal in size. The mediastinal and hilar contours are
normal. There is a small left pleural effusion and minimal overlying
atelectasis.
IMPRESSION: Right-sided chest tube in place.  No pneumothorax.

Small left pleural effusion and minimal left basilar atelectasis.

## 2018-08-16 IMAGING — CR DG CHEST 2V
2 series · 2 of 2 positions shown · non-contrast
Comparison: Chest x-ray of August 01, 2016

CLINICAL DATA: Palpitations since having mitral valve repair.
History of CHF. No report of shortness of breath or chest pain.
Remote history of smoking.

EXAM:
CHEST  2 VIEW

[chest pa]
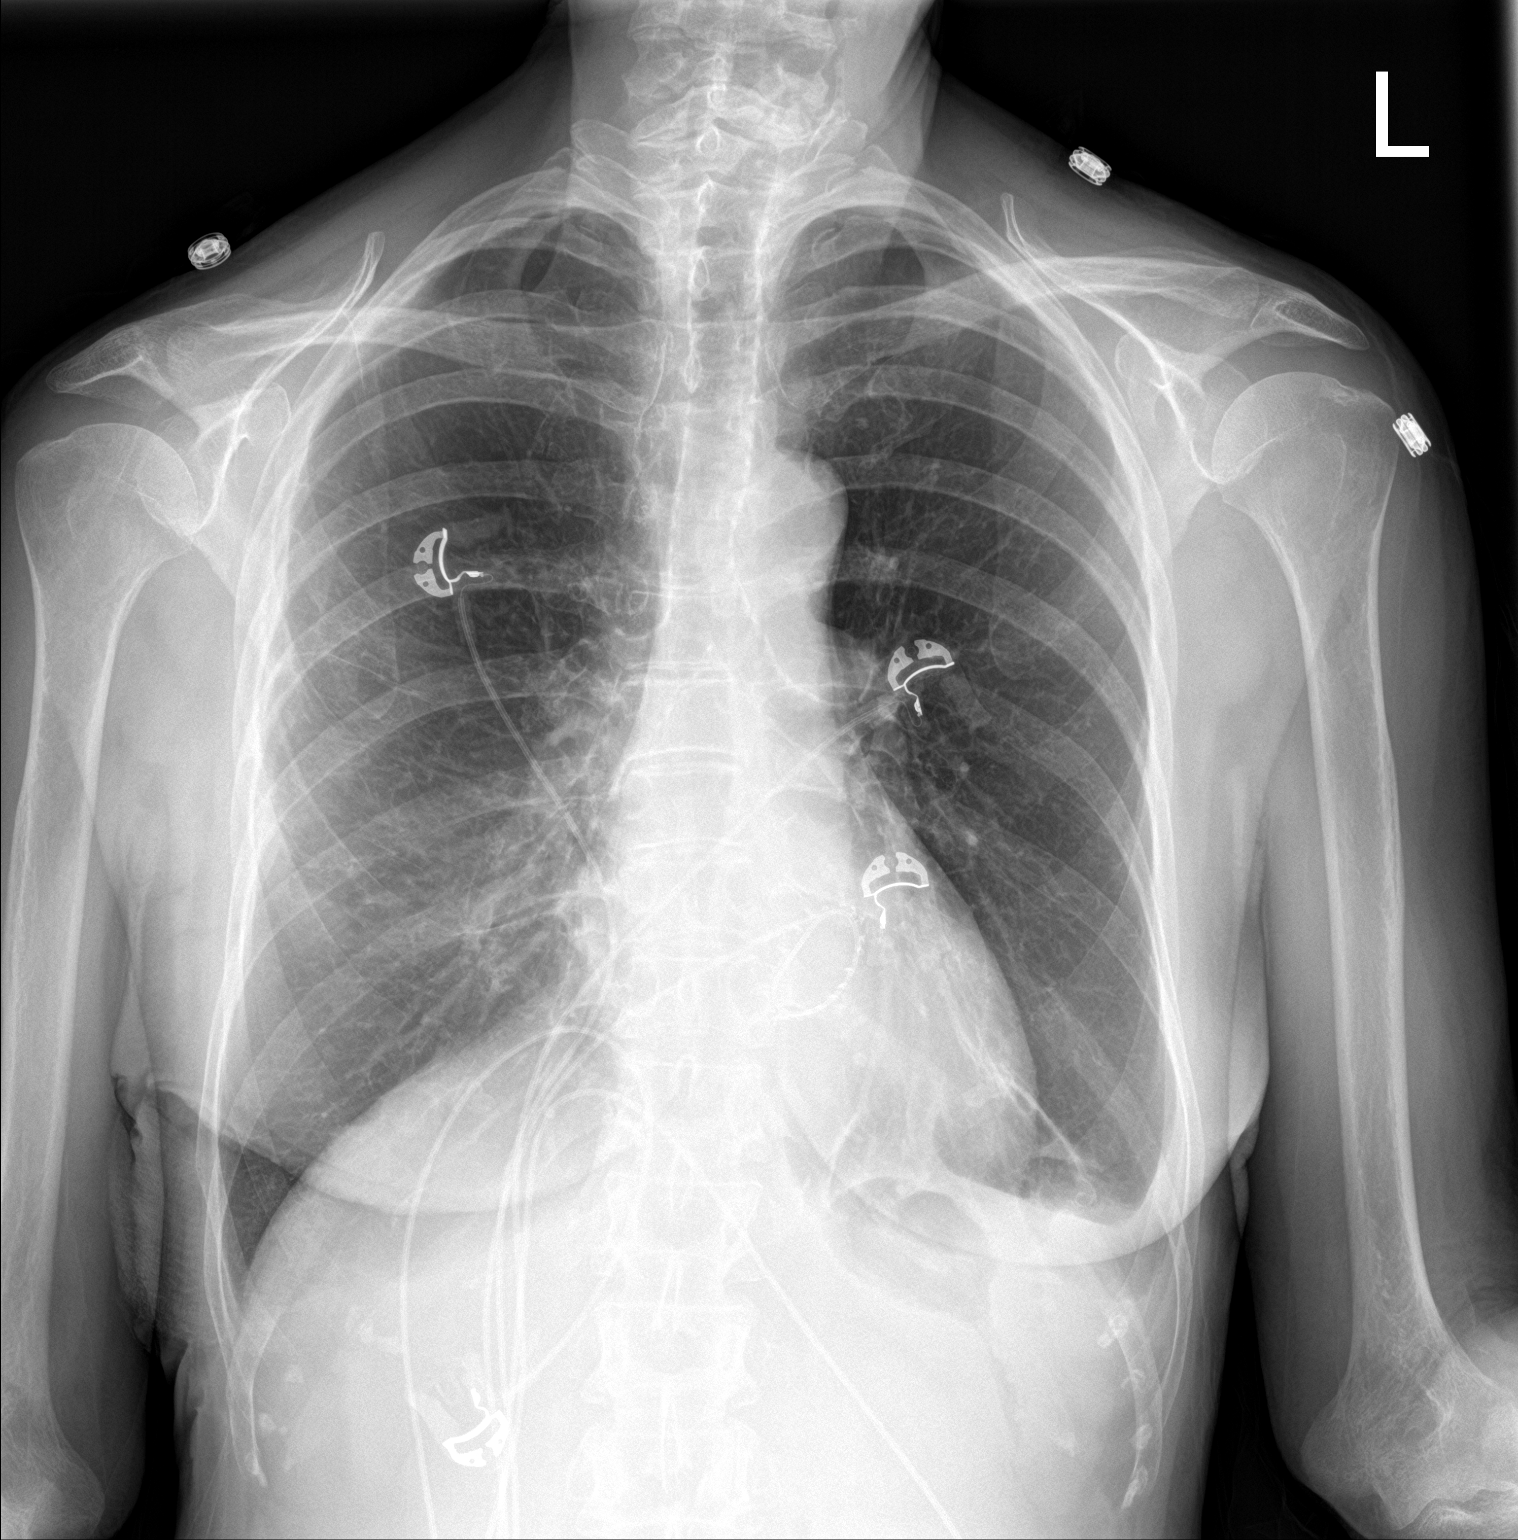

[chest lat]
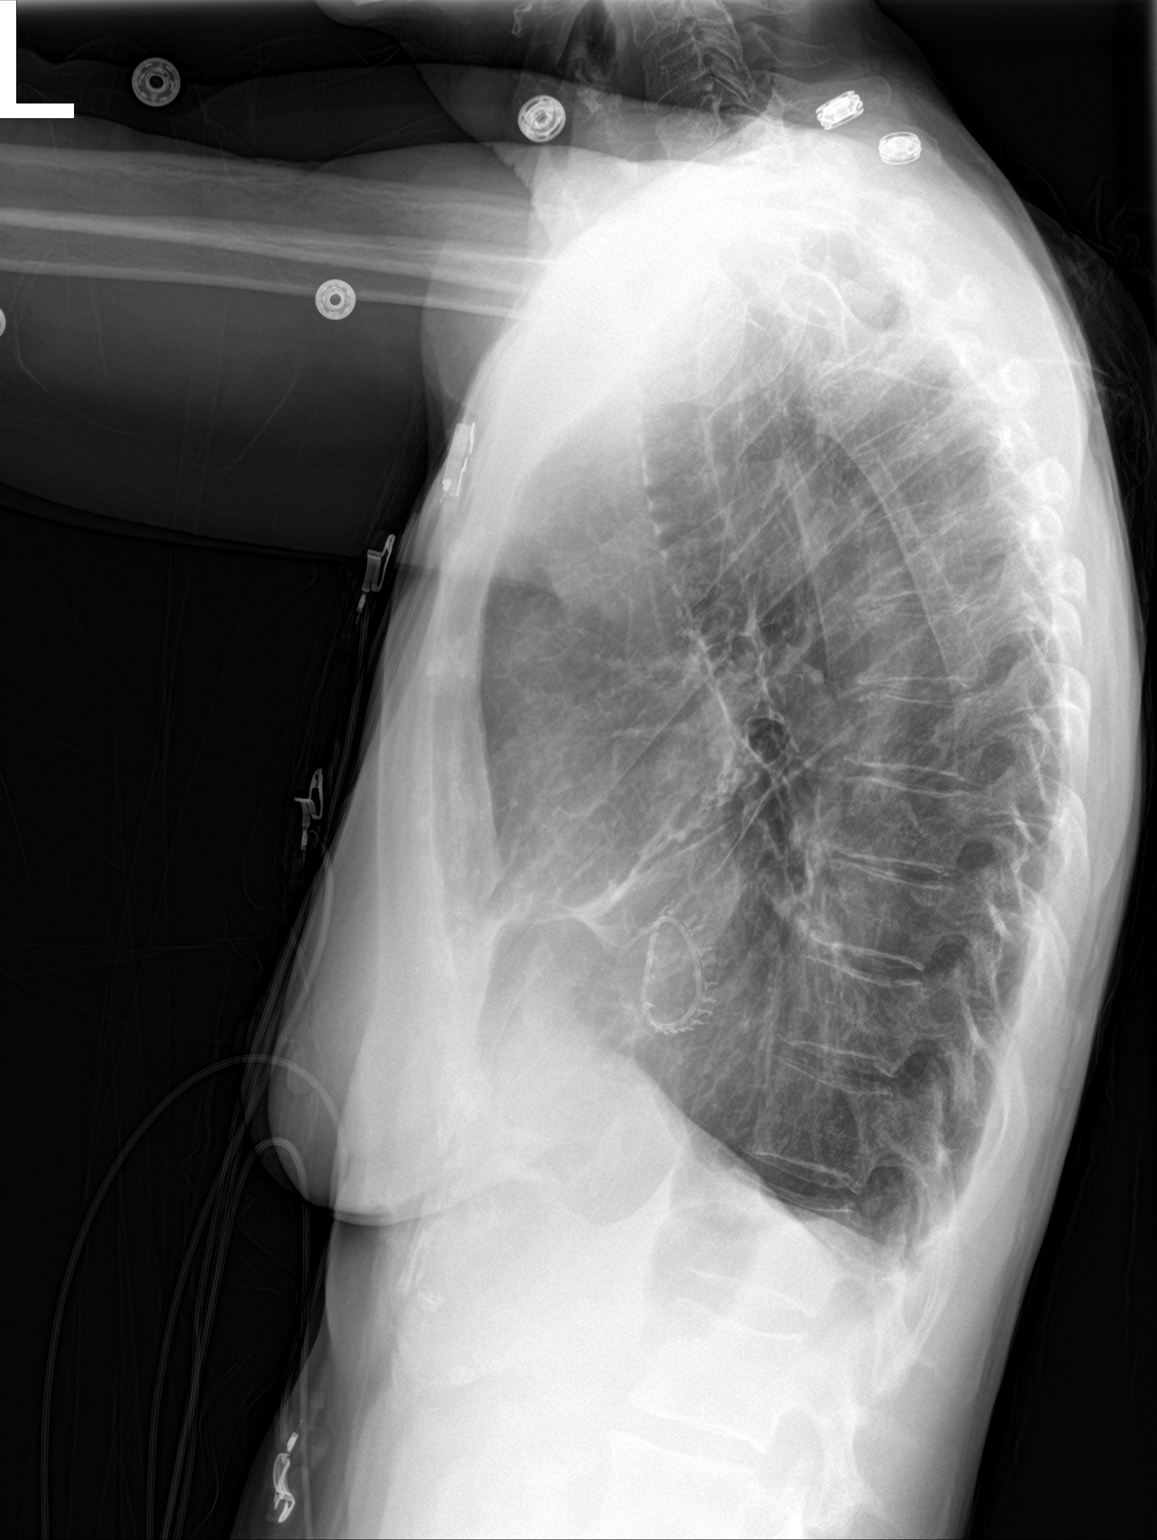

[2 of 2 positions shown; findings below may reference images not displayed]

FINDINGS: The lungs remain hyperinflated with hemidiaphragm flattening. There
is a small left pleural effusion versus pleural scarring which is
stable. There is no alveolar infiltrate. The heart and pulmonary
vascularity are normal. The mitral valve repair ring is visible.
There is faint calcification in the wall of the aortic arch. The
mediastinum and normal in width. The bony thorax exhibits no acute
abnormality.
IMPRESSION: COPD. No CHF or pneumonia. Stable pleural thickening or pleural
fluid at the left lung base.

Thoracic aortic atherosclerosis.

## 2021-10-15 ENCOUNTER — Encounter: Payer: Self-pay | Admitting: Gastroenterology
